# Patient Record
Sex: Female | Born: 1995 | Race: Black or African American | Hispanic: No | Marital: Single | Smoking: Never smoker
Health system: Southern US, Community
[De-identification: ages and names within clinical notes are randomized; demographics above are authoritative.]

## PROBLEM LIST (undated history)

## (undated) DIAGNOSIS — O99119 Other diseases of the blood and blood-forming organs and certain disorders involving the immune mechanism complicating pregnancy, unspecified trimester: Secondary | ICD-10-CM

## (undated) DIAGNOSIS — J020 Streptococcal pharyngitis: Secondary | ICD-10-CM

## (undated) DIAGNOSIS — Z789 Other specified health status: Secondary | ICD-10-CM

## (undated) DIAGNOSIS — O24419 Gestational diabetes mellitus in pregnancy, unspecified control: Secondary | ICD-10-CM

## (undated) DIAGNOSIS — D696 Thrombocytopenia, unspecified: Secondary | ICD-10-CM

## (undated) HISTORY — DX: Thrombocytopenia, unspecified: O99.119

## (undated) HISTORY — PX: NO PAST SURGERIES: SHX2092

## (undated) HISTORY — DX: Thrombocytopenia, unspecified: D69.6

## (undated) HISTORY — DX: Gestational diabetes mellitus in pregnancy, unspecified control: O24.419

## (undated) HISTORY — DX: Other specified health status: Z78.9

---
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
## 2017-01-18 ENCOUNTER — Emergency Department (HOSPITAL_COMMUNITY): Payer: Medicaid Other

## 2017-01-18 ENCOUNTER — Emergency Department (HOSPITAL_COMMUNITY)
Admission: EM | Admit: 2017-01-18 | Discharge: 2017-01-18 | Disposition: A | Discharge status: Home / Self Care | Payer: Medicaid Other | Attending: Emergency Medicine | Admitting: Emergency Medicine

## 2017-01-18 ENCOUNTER — Encounter (HOSPITAL_COMMUNITY): Payer: Self-pay | Admitting: Emergency Medicine

## 2017-01-18 DIAGNOSIS — S2020XA Contusion of thorax, unspecified, initial encounter: Secondary | ICD-10-CM | POA: Diagnosis not present

## 2017-01-18 DIAGNOSIS — Z79899 Other long term (current) drug therapy: Secondary | ICD-10-CM | POA: Insufficient documentation

## 2017-01-18 DIAGNOSIS — X58XXXA Exposure to other specified factors, initial encounter: Secondary | ICD-10-CM | POA: Insufficient documentation

## 2017-01-18 DIAGNOSIS — J029 Acute pharyngitis, unspecified: Secondary | ICD-10-CM | POA: Insufficient documentation

## 2017-01-18 DIAGNOSIS — S20212A Contusion of left front wall of thorax, initial encounter: Secondary | ICD-10-CM

## 2017-01-18 DIAGNOSIS — Y929 Unspecified place or not applicable: Secondary | ICD-10-CM | POA: Insufficient documentation

## 2017-01-18 DIAGNOSIS — Y999 Unspecified external cause status: Secondary | ICD-10-CM | POA: Diagnosis not present

## 2017-01-18 DIAGNOSIS — Y939 Activity, unspecified: Secondary | ICD-10-CM | POA: Insufficient documentation

## 2017-01-18 LAB — RAPID STREP SCREEN (MED CTR MEBANE ONLY): STREPTOCOCCUS, GROUP A SCREEN (DIRECT): NEGATIVE
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
## 2017-01-18 NOTE — ED Notes (Signed)
Patient transported to X-ray 

## 2017-01-18 NOTE — ED Notes (Signed)
Returned from xray

## 2017-01-18 NOTE — Discharge Instructions (Signed)
You can take Tylenol or Ibuprofen as directed for pain.  You can apply ice to the affected area.   Return to the Emergency Department for any worsening pain, difficulty breathing, chest pain, difficulty swallowing or any other concerns.     If you do not have a primary care doctor you see regularly, please you the list below. Please call them to arrange for follow-up.    No Primary Care Doctor [facility]  [phone] Other agencies that provide inexpensive medical care    [facility]  [phone]    [facility]  [phone]    [facility]  [phone]    [facility]  [phone]    [facility]  [phone]    [facility]  [phone][
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
## 2017-01-18 NOTE — ED Triage Notes (Signed)
Onset today developed sore throat and left rib pain. Airway intact bilateral equal chest rise and fall.

## 2017-01-20 LAB — CULTURE, GROUP A STREP (THRC)

## 2017-11-07 ENCOUNTER — Encounter (HOSPITAL_COMMUNITY): Payer: Self-pay | Admitting: *Deleted

## 2017-11-07 ENCOUNTER — Emergency Department (HOSPITAL_COMMUNITY)
Admission: EM | Admit: 2017-11-07 | Discharge: 2017-11-07 | Disposition: A | Discharge status: Home / Self Care | Payer: Medicaid Other | Attending: Emergency Medicine | Admitting: Emergency Medicine

## 2017-11-07 DIAGNOSIS — B002 Herpesviral gingivostomatitis and pharyngotonsillitis: Secondary | ICD-10-CM | POA: Insufficient documentation

## 2017-11-07 DIAGNOSIS — R21 Rash and other nonspecific skin eruption: Secondary | ICD-10-CM | POA: Diagnosis present

## 2017-11-07 MED ORDER — ACYCLOVIR 400 MG PO TABS: 400.0000 mg | ORAL_TABLET | Freq: Three times a day (TID) | ORAL | 1 refills | Status: AC

## 2017-11-07 NOTE — ED Provider Notes (Signed)
[facility]-EMERGENCY DEPT Provider Note   CSN: [id number] Arrival date & time: 11/07/17  1311     History   Chief Complaint Chief Complaint  Patient presents with  . Mouth Lesions    HPI Shelly Watson is a 22 y.o. female Pt presenting to the ED with 3 days of rash to lips.  She states she initially noticed the rash on the left corner of mouth.  She states it then spread to her bottom lip has been painful and itchy.  States she has had similar rash in the past, however it has not been this significant.  States she is not currently sexually active.  Denies any sores inside of her mouth, or any other complaints.  The history is provided by the patient.    Past Medical History:  Diagnosis Date  . Strep throat     There are no active problems to display for this patient.   History reviewed. No pertinent surgical history.   OB History   None      Home Medications    Prior to Admission medications   Medication Sig Start Date End Date Taking? Authorizing Provider  acetaminophen (TYLENOL) 160 MG/5ML solution Take 10 mLs (320 mg total) by mouth every 6 (six) hours as needed. 09/26/14   Marlon PelGreene, Tiffany, PA-C  acyclovir (ZOVIRAX) 400 MG tablet Take 1 tablet (400 mg total) by mouth 3 (three) times daily for 10 days. 11/07/17 11/17/17  Natalija Mavis, [place] N, PA-C  bacitracin 500 UNIT/GM ointment Apply 1 application topically 3 (three) times daily. 04/23/14   Lowanda FosterBrewer, Mindy, NP  naproxen sodium (ANAPROX) 220 MG tablet Take 220 mg by mouth 2 (two) times daily as needed (for pain).     [provider]  ondansetron (ZOFRAN ODT) 4 MG disintegrating tablet Take 1 tablet (4 mg total) by mouth every 8 (eight) hours as needed. 03/01/14   Viviano Simasobinson, Lauren, NP    Family History No family history on file.  Social History Social History   Tobacco Use  . Smoking status: Never Smoker  . Smokeless tobacco: Never Used  Substance Use Topics  . Alcohol use: No  . Drug  use: No     Allergies   Aspirin   Review of Systems Review of Systems  Constitutional: Negative for fever.  Skin: Positive for rash.     Physical Exam Updated Vital Signs BP 93/69 (BP Location: Left Arm)   Pulse 60   Temp 98.4 F (36.9 C) (Oral)   Resp 15   Ht 5\' 4"  (1.626 m)   Wt 68 kg (150 lb)   SpO2 100%   BMI 25.75 kg/m   Physical Exam  Constitutional: She appears well-developed and well-nourished.  HENT:  Head: Normocephalic and atraumatic.  Mouth/Throat:    multiple areas of grouped clear vesicles on erythematous base in corners of lips as well as lower lip.  No involvement of oral mucosa.  No purulence or surrounding erythema. No desquamation. Oropharynx clear without edema. Tolerating secretions.  Eyes: Conjunctivae are normal.  Pulmonary/Chest: Effort normal.  Psychiatric: She has a normal mood and affect. Her behavior is normal.  Nursing note and vitals reviewed.    ED Treatments / Results  Labs (all labs ordered are listed, but only abnormal results are displayed) Labs Reviewed - No data to display  EKG None  Radiology No results found.  Procedures Procedures (including critical care time)  Medications Ordered in ED Medications - No data to display   Initial  Impression / Assessment and Plan / ED Course  I have reviewed the triage vital signs and the nursing notes.  Pertinent labs & imaging results that were available during my care of the patient were reviewed by me and considered in my medical decision making (see chart for details).     Patient with rash surrounding mouth consistent with herpes simplex virus.  Patient is afebrile, tolerating secretions.  No involvement of intraoral mucosa.  No purulence or signs of bacterial infection. Rx for acyclovir provided. Discussed PCP follow up and return precautions. Safe for discharge.  Discussed results, findings, treatment and follow up. Patient advised of return precautions. Patient  verbalized understanding and agreed with plan.  Final Clinical Impressions(s) / ED Diagnoses   Final diagnoses:  Oral herpes simplex infection    ED Discharge Orders        Ordered    acyclovir (ZOVIRAX) 400 MG tablet  3 times daily     11/07/17 1419       Layna Roeper, [place], [place] 11/07/17 1426    Arby Barrette, MD 11/08/17 [phone]

## 2017-11-07 NOTE — ED Triage Notes (Signed)
2 mouth lesions noted on lips causing pain

## 2017-11-07 NOTE — Discharge Instructions (Addendum)
Take the antiviral (acyclovir) 3 times per day (every 8 hours) for 7-10 days. Be aware that this is contagious. You can use over the counter topical agents as needed for pain. You can also take advil/ibuprofen every 6 hours as needed for pain.

## 2018-07-17 IMAGING — DX DG CHEST 2V
2 series · 2 of 2 positions shown · non-contrast
Comparison: None.

CLINICAL DATA: Pleuritic left rib and chest pain.  No known injury.

EXAM:
CHEST  2 VIEW

[w chest pa]
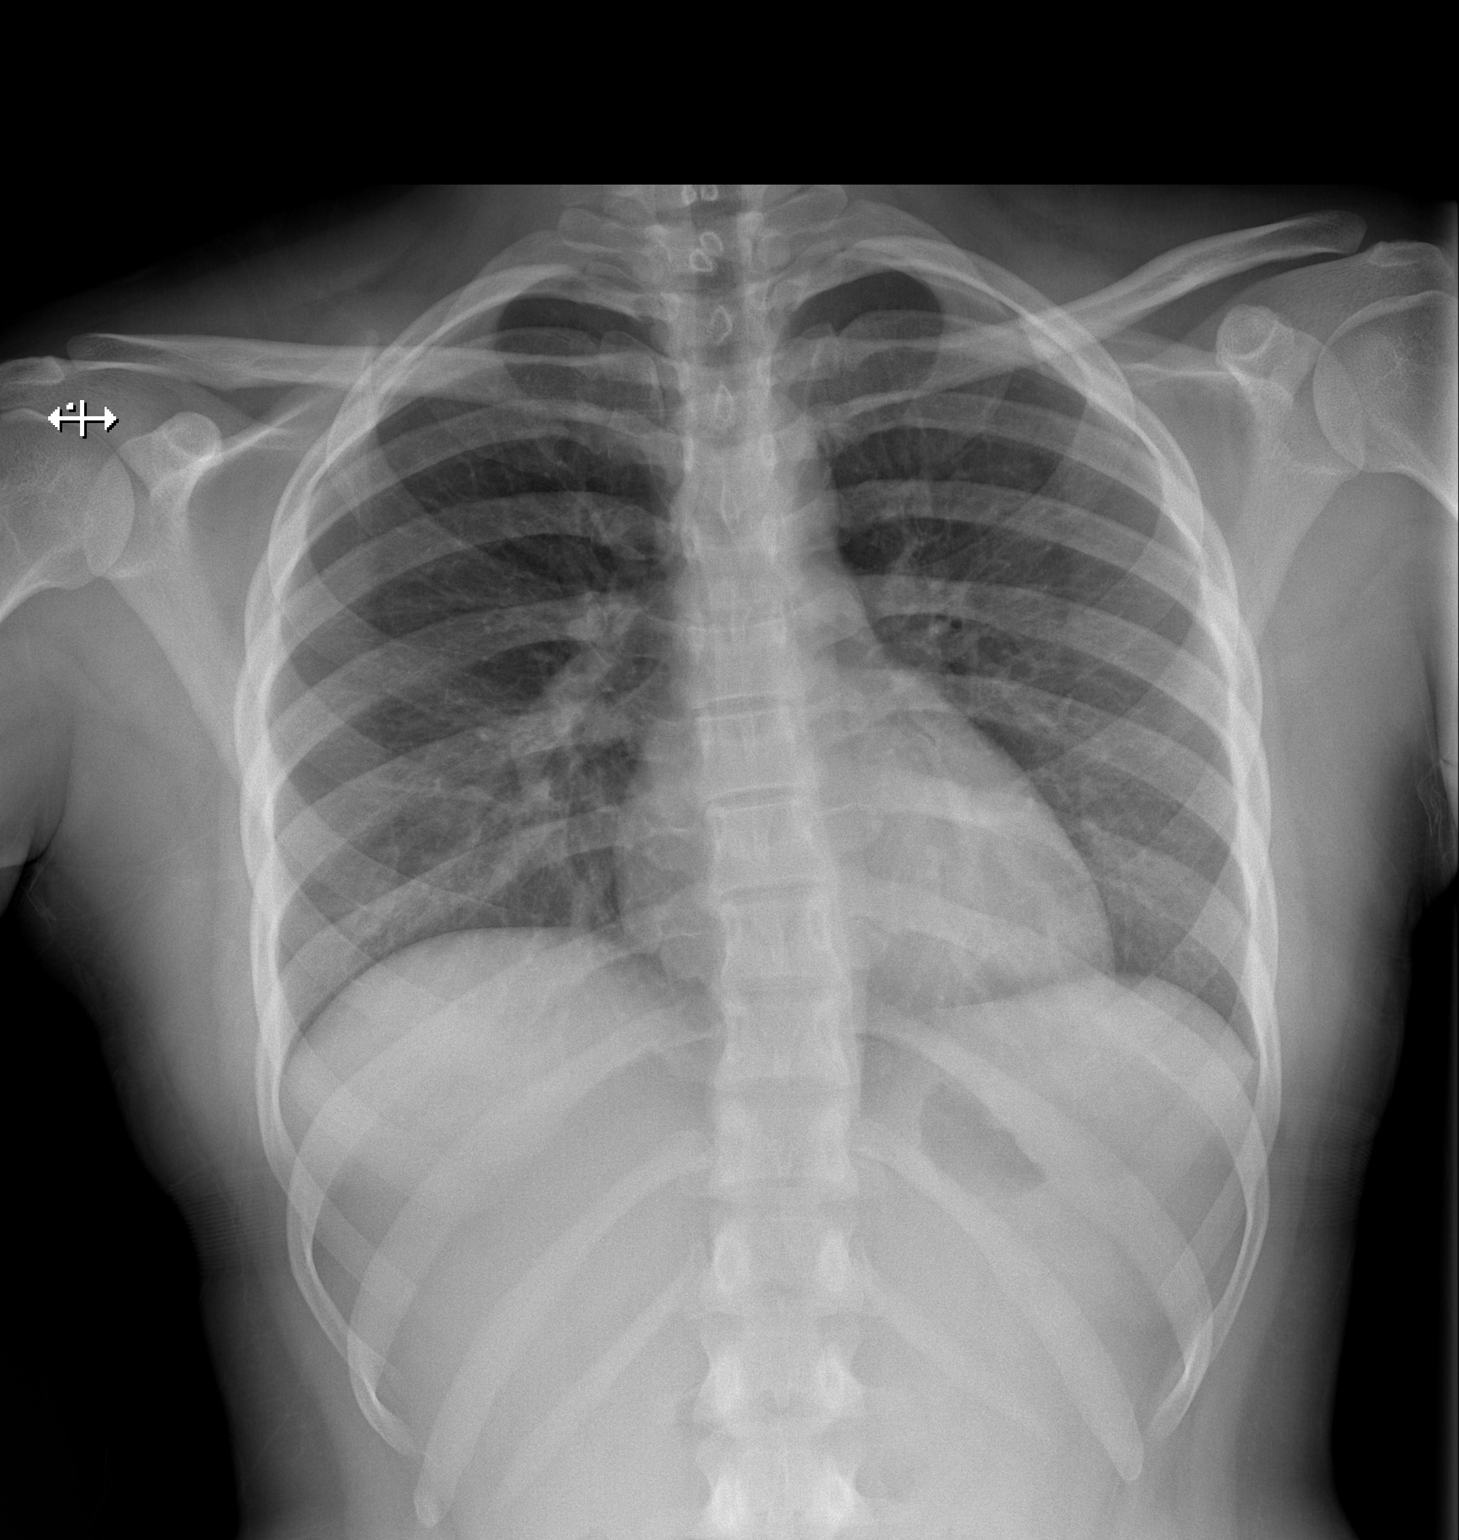

[w chest lat]
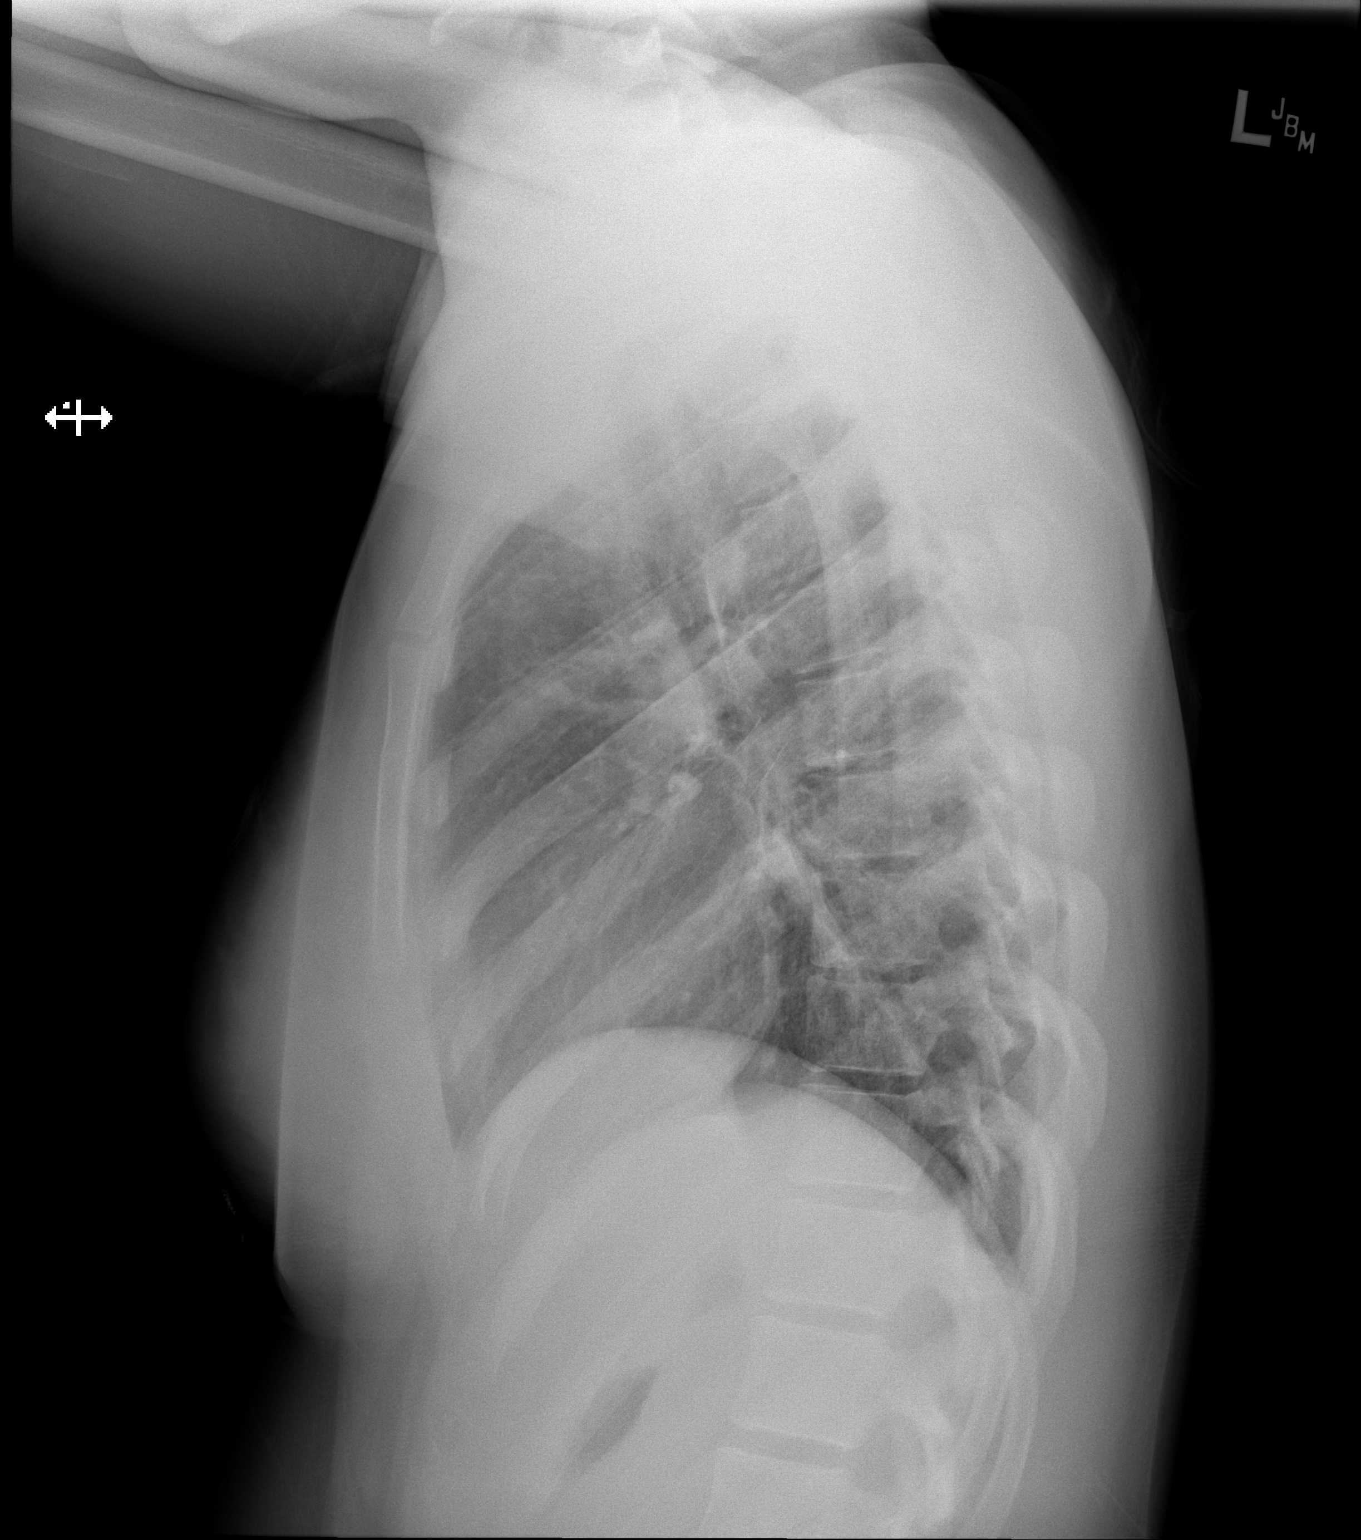

[2 of 2 positions shown; findings below may reference images not displayed]

FINDINGS: The heart size and mediastinal contours are within normal limits.
Both lungs are clear. No evidence of pneumothorax or pleural
effusion. The visualized skeletal structures are unremarkable.
IMPRESSION: Negative.  No active cardiopulmonary disease.

## 2022-01-16 ENCOUNTER — Ambulatory Visit
Admission: EM | Admit: 2022-01-16 | Discharge: 2022-01-16 | Disposition: A | Discharge status: Home / Self Care | Payer: 59 | Attending: Urgent Care | Admitting: Urgent Care

## 2022-01-16 ENCOUNTER — Encounter: Payer: Self-pay | Admitting: Emergency Medicine

## 2022-01-16 DIAGNOSIS — A084 Viral intestinal infection, unspecified: Secondary | ICD-10-CM | POA: Diagnosis not present

## 2022-01-16 DIAGNOSIS — R052 Subacute cough: Secondary | ICD-10-CM | POA: Diagnosis not present

## 2022-01-16 DIAGNOSIS — R0982 Postnasal drip: Secondary | ICD-10-CM | POA: Diagnosis not present

## 2022-01-16 MED ORDER — ONDANSETRON 8 MG PO TBDP: 8.0000 mg | ORAL_TABLET | Freq: Three times a day (TID) | ORAL | 0 refills | Status: DC | PRN

## 2022-01-16 MED ORDER — PROMETHAZINE-DM 6.25-15 MG/5ML PO SYRP: 2.5000 mL | ORAL_SOLUTION | Freq: Three times a day (TID) | ORAL | 0 refills | Status: DC | PRN

## 2022-01-16 MED ORDER — PSEUDOEPHEDRINE HCL 60 MG PO TABS: 60.0000 mg | ORAL_TABLET | Freq: Three times a day (TID) | ORAL | 0 refills | Status: DC | PRN

## 2022-01-16 MED ORDER — ONDANSETRON 8 MG PO TBDP
8.0000 mg | ORAL_TABLET | Freq: Once | ORAL | Status: AC
  Administered 2022-01-16: 8 mg via ORAL

## 2022-01-16 MED ORDER — CETIRIZINE HCL 10 MG PO TABS: 10.0000 mg | ORAL_TABLET | Freq: Every day | ORAL | 0 refills | Status: DC

## 2022-01-16 NOTE — ED Triage Notes (Signed)
Pt here with nausea and abdominal pain since eating moldy strawberries last night.

## 2022-01-16 NOTE — ED Provider Notes (Signed)
[facility]   MRN: [id number] DOB: [birth year]  Subjective:   Shelly Watson is a 26 y.o. female presenting for acute onset since very early this morning of upset stomach, nausea, belly pain and cramping.  No vomiting, diarrhea.  No bloody stools.  No fevers.  Patient has also had scratchy throat with drainage.  She has been coughing.  No chest pain, shortness of breath or wheezing.  No smoking.  No drug use, no alcohol use.  No current facility-administered medications for this encounter. No current outpatient medications on file.   Allergies  Allergen Reactions   Aspirin Itching and Rash    Past Medical History:  Diagnosis Date   Strep throat      History reviewed. No pertinent surgical history.  History reviewed. No pertinent family history.  Social History   Tobacco Use   Smoking status: Never   Smokeless tobacco: Never  Substance Use Topics   Alcohol use: No   Drug use: No    ROS   Objective:   Vitals: BP 96/62 (BP Location: Left Arm)   Pulse 68   Temp 98.4 F (36.9 C) (Oral)   Resp 20   LMP 12/19/2021 (Exact Date)   SpO2 96%   Physical Exam Constitutional:      General: She is not in acute distress.    Appearance: Normal appearance. She is well-developed. She is not ill-appearing, toxic-appearing or diaphoretic.  HENT:     Head: Normocephalic and atraumatic.     Nose: Nose normal.     Mouth/Throat:     Mouth: Mucous membranes are moist.     Pharynx: No pharyngeal swelling, oropharyngeal exudate, posterior oropharyngeal erythema or uvula swelling.     Tonsils: No tonsillar exudate or tonsillar abscesses. 0 on the right. 0 on the left.     Comments: Significant postnasal drainage overlying pharynx. Eyes:     General: No scleral icterus.       Right eye: No discharge.        Left eye: No discharge.     Extraocular Movements: Extraocular movements intact.     Conjunctiva/sclera: Conjunctivae normal.  Cardiovascular:      Rate and Rhythm: Normal rate.  Pulmonary:     Effort: Pulmonary effort is normal.  Abdominal:     General: There is no distension.     Palpations: Abdomen is soft. There is no mass.     Tenderness: There is abdominal tenderness (Generalized and mild throughout). There is no right CVA tenderness, left CVA tenderness, guarding or rebound.     Comments: Hyperactive bowel sounds.  Skin:    General: Skin is warm and dry.  Neurological:     General: No focal deficit present.     Mental Status: She is alert and oriented to person, place, and time.  Psychiatric:        Mood and Affect: Mood normal.        Behavior: Behavior normal.        Thought Content: Thought content normal.        Judgment: Judgment normal.     Assessment and Plan :   PDMP not reviewed this encounter.  1. Viral gastroenteritis   2. Post-nasal drainage   3. Subacute cough    Make sure you push fluids drinking mostly water but mix it with Gatorade.  Try to eat light meals including soups, broths and soft foods, fruits.  You may use Zofran for your nausea and vomiting  once every 8 hours.  Imodium can help with diarrhea but use this carefully limiting it to 1-2 times per day only if you are having a lot of diarrhea.  Suspect patient also has a concurrent viral URI.  Recommend supportive care. Deferred imaging given clear cardiopulmonary exam, hemodynamically stable vital signs. Counseled patient on potential for adverse effects with medications prescribed/recommended today, ER and return-to-clinic precautions discussed, patient verbalized understanding.    Wallis Bamberg, [place] 01/16/22 1651

## 2022-01-16 NOTE — Discharge Instructions (Addendum)
Make sure you push fluids drinking mostly water but mix it with Gatorade.  Try to eat light meals including soups, broths and soft foods, fruits.  You may use Zofran for your nausea and vomiting once every 8 hours.  Imodium (loperamide) can help with diarrhea but use this carefully limiting it to 1-2 times per day only if you are having a lot of diarrhea.  Please return to the clinic if symptoms worsen or you start having severe abdominal pain not helped by taking Tylenol or start having bloody stools or blood in the vomit. 

## 2022-07-08 ENCOUNTER — Ambulatory Visit: Payer: No Typology Code available for payment source | Admitting: Medical

## 2022-07-21 NOTE — L&D Delivery Note (Signed)
[facility] Delivery Note  Elisabetta Livingood is a 27 y.o. G1P0000 s/p SVD at [redacted]w[redacted]d. She was admitted for SROM.   ROM: 13h 38m with clear fluid GBS Status: Positive, given PCN Maximum Maternal Temperature: 98.9  Labor Progress: Pt came in with positive ferning but declined all cervical checks until 1815 when she agreed to of fentanyl and a cervical exam. Found to be complete and 0-+1 station.   Delivery Date/Time: 05/22/23 at 1917 Delivery: Head delivered LOA but rotated to direct OP. No nuchal cord present. dystocia followed that was resolved by McRoberts and grasping of the anterior shoulder then baby rotated to LOA. Shoulder and body delivered in usual fashion. Infant with spontaneous cry (NICU team able to leave), placed on mother's abdomen, dried and stimulated. Cord clamped x 2 after 5-minute delay, and cut by partner Colin Mulders). Cord blood drawn. Placenta delivered spontaneously, intact, with 3-vessel cord. Fundus firm with massage and Pitocin. Labia, perineum, vagina, and cervix inspected, 2nd degree laceration found and repaired with 3.0 vicryl (pt needed numbing twice and an additional of fentanyl and still found it difficult to tolerate more superficial stitching - lac well approximated and hemostatic by end of repair).   Placenta: spontaneous, intact, sent to L&D for disposal Complications: none Lacerations: 2nd degree perineal EBL: Analgesia: none  Postpartum Planning [x]  transfer orders to MB [x]  discharge summary started & shared [x]  message to sent to schedule follow-up  [x]  lists updated  Infant: Girl  APGARs 8/9  pending  Edd Arbour, CNM, IBCLC Certified Nurse Midwife, [facility] for [employer], [facility] 05/22/2023, 7:56 PM

## 2022-08-06 ENCOUNTER — Ambulatory Visit
Admission: EM | Admit: 2022-08-06 | Discharge: 2022-08-06 | Disposition: A | Discharge status: Home / Self Care | Payer: No Typology Code available for payment source | Attending: Physician Assistant | Admitting: Physician Assistant

## 2022-08-06 DIAGNOSIS — Z20822 Contact with and (suspected) exposure to covid-19: Secondary | ICD-10-CM | POA: Diagnosis not present

## 2022-08-06 DIAGNOSIS — J0100 Acute maxillary sinusitis, unspecified: Secondary | ICD-10-CM | POA: Insufficient documentation

## 2022-08-06 MED ORDER — PROMETHAZINE-DM 6.25-15 MG/5ML PO SYRP: 2.5000 mL | ORAL_SOLUTION | Freq: Two times a day (BID) | ORAL | 0 refills | Status: DC | PRN

## 2022-08-06 MED ORDER — AMOXICILLIN-POT CLAVULANATE 875-125 MG PO TABS: 1.0000 | ORAL_TABLET | Freq: Two times a day (BID) | ORAL | 0 refills | Status: DC

## 2022-08-06 NOTE — Discharge Instructions (Signed)
I am concerned that you have a bacterial sinus infection given your symptoms have been present for more than a week.  Please take Augmentin twice daily for 7 days.  Take Promethazine DM for cough.  This will make you sleepy so do not drive or drink alcohol while taking it.  You can use over-the-counter medication for additional symptom relief.  Make sure that you rest and drink plenty of fluid.  We will contact you if your COVID test is positive.  Please monitor your MyChart for these results.  If her symptoms are improving within a week return for reevaluation.  If anything worsens you need to be seen immediately.

## 2022-08-06 NOTE — ED Provider Notes (Signed)
[facility]    CSN: [id number] Arrival date & time: 08/06/22  1514      History   Chief Complaint Chief Complaint  Patient presents with   Headache   Fever    HPI Shelly Watson is a 27 y.o. female.   Patient presents today with a week plus long history of URI symptoms including headache, fever, cough, congestion.  She denies any chest pain, shortness of breath, vomiting, diarrhea.  She has tried NyQuil without improvement of symptoms.  Reports sick contacts at work with tested positive for COVID.  She has not had COVID in the past.  She has not had COVID or influenza vaccines.  She denies any significant past medical history including asthma, COPD, smoking, diabetes.  She does have allergies managed with as needed over-the-counter antihistamines.  She denies any recent antibiotics or steroids.  She is confident that she is not pregnant.  She is having difficulty with daily activities and had to leave work as a result of symptoms.  She is required to have a COVID test before returning and so requesting that this be completed during visit today.    Past Medical History:  Diagnosis Date   Strep throat     There are no problems to display for this patient.   History reviewed. No pertinent surgical history.  OB History   No obstetric history on file.      Home Medications    Prior to Admission medications   Medication Sig Start Date End Date Taking? Authorizing Provider  amoxicillin-clavulanate (AUGMENTIN) 875-125 MG tablet Take 1 tablet by mouth every 12 (twelve) hours. 08/06/22  Yes Chalsea Darko K, PA-C  promethazine-dextromethorphan (PROMETHAZINE-DM) 6.25-15 MG/5ML syrup Take 2.5 mLs by mouth 2 (two) times daily as needed for cough. 08/06/22   Shahzad Thomann, Noberto Retort, PA-C    Family History History reviewed. No pertinent family history.  Social History Social History   Tobacco Use   Smoking status: Never   Smokeless tobacco: Never  Substance Use Topics    Alcohol use: No   Drug use: No     Allergies   Aspirin   Review of Systems Review of Systems  Constitutional:  Positive for activity change, fatigue and fever. Negative for appetite change.  HENT:  Positive for congestion, postnasal drip, sinus pressure and sore throat. Negative for sneezing.   Respiratory:  Positive for cough. Negative for shortness of breath.   Cardiovascular:  Negative for chest pain.  Gastrointestinal:  Positive for nausea. Negative for abdominal pain, diarrhea and vomiting.  Neurological:  Positive for headaches. Negative for dizziness and light-headedness.     Physical Exam Triage Vital Signs ED Triage Vitals  Enc Vitals Group     BP 08/06/22 1609 99/64     Pulse Rate 08/06/22 1609 62     Resp 08/06/22 1609 15     Temp 08/06/22 1609 98.6 F (37 C)     Temp Source 08/06/22 1609 Oral     SpO2 08/06/22 1609 97 %     Weight --      Height --      Head Circumference --      Peak Flow --      Pain Score 08/06/22 1611 6     Pain Loc --      Pain Edu? --      Excl. in GC? --    No data found.  Updated Vital Signs BP 99/64 (BP Location: Right Arm)   Pulse  62   Temp 98.6 F (37 C) (Oral)   Resp 15   LMP  (Within Weeks) Comment: 1 week  SpO2 97%   Visual Acuity Right Eye Distance:   Left Eye Distance:   Bilateral Distance:    Right Eye Near:   Left Eye Near:    Bilateral Near:     Physical Exam Vitals reviewed.  Constitutional:      General: She is awake. She is not in acute distress.    Appearance: Normal appearance. She is well-developed. She is not ill-appearing.     Comments: Very pleasant female appears stated age in no acute distress sitting comfortably in exam room  HENT:     Head: Normocephalic and atraumatic.     Right Ear: Tympanic membrane, ear canal and external ear normal. Tympanic membrane is not erythematous or bulging.     Left Ear: Tympanic membrane, ear canal and external ear normal. Tympanic membrane is not  erythematous or bulging.     Nose:     Right Sinus: Maxillary sinus tenderness present. No frontal sinus tenderness.     Left Sinus: Maxillary sinus tenderness present. No frontal sinus tenderness.     Mouth/Throat:     Pharynx: Uvula midline. Posterior oropharyngeal erythema present. No oropharyngeal exudate.     Comments: Erythema and drainage in posterior oropharynx Cardiovascular:     Rate and Rhythm: Normal rate and regular rhythm.     Heart sounds: Normal heart sounds, S1 normal and S2 normal. No murmur heard. Pulmonary:     Effort: Pulmonary effort is normal.     Breath sounds: Normal breath sounds. No wheezing, rhonchi or rales.     Comments: Clear to auscultation bilaterally Psychiatric:        Behavior: Behavior is cooperative.      UC Treatments / Results  Labs (all labs ordered are listed, but only abnormal results are displayed) Labs Reviewed  SARS CORONAVIRUS 2 (TAT 6-24 HRS)    EKG   Radiology No results found.  Procedures Procedures (including critical care time)  Medications Ordered in UC Medications - No data to display  Initial Impression / Assessment and Plan / UC Course  I have reviewed the triage vital signs and the nursing notes.  Pertinent labs & imaging results that were available during my care of the patient were reviewed by me and considered in my medical decision making (see chart for details).     Patient is well-appearing, afebrile, nontoxic, nontachycardic.  Given her prolonged and recent worsening of symptoms concern for secondary bacterial infection.  She was started on Augmentin twice daily for 7 days.  Discussed that antibiotics can decrease the effectiveness of birth control and she should use backup birth control such as a condom while on this medication.  Recommended over-the-counter medication including NyQuil, Flonase, Tylenol.  She is to rest and drink plenty of fluid.  Recommended that she use nasal saline/sinus rinses for  additional symptom relief.  COVID testing was obtained but we discussed that this would not change our management since she has been symptomatic for over a week.  She is to rest and drink plenty of fluid.  Promethazine DM was sent for cough with instruction not to drive or drink alcohol while taking it.  Discussed that if her symptoms are not improving within a week she is to return for reevaluation.  If she has any worsening or changing symptoms she needs to be seen immediately.  Strict return precautions given.  Work excuse note provided.  Final Clinical Impressions(s) / UC Diagnoses   Final diagnoses:  Acute non-recurrent maxillary sinusitis  Encounter for laboratory testing for COVID-19 virus     Discharge Instructions      I am concerned that you have a bacterial sinus infection given your symptoms have been present for more than a week.  Please take Augmentin twice daily for 7 days.  Take Promethazine DM for cough.  This will make you sleepy so do not drive or drink alcohol while taking it.  You can use over-the-counter medication for additional symptom relief.  Make sure that you rest and drink plenty of fluid.  We will contact you if your COVID test is positive.  Please monitor your MyChart for these results.  If her symptoms are improving within a week return for reevaluation.  If anything worsens you need to be seen immediately.     ED Prescriptions     Medication Sig Dispense Auth. Provider   promethazine-dextromethorphan (PROMETHAZINE-DM) 6.25-15 MG/5ML syrup Take 2.5 mLs by mouth 2 (two) times daily as needed for cough. 100 mL Kash Davie K, PA-C   amoxicillin-clavulanate (AUGMENTIN) 875-125 MG tablet Take 1 tablet by mouth every 12 (twelve) hours. 14 tablet Tymika Grilli, Derry Skill, PA-C      PDMP not reviewed this encounter.   Terrilee Croak, PA-C 08/06/22 1634

## 2022-08-06 NOTE — ED Triage Notes (Signed)
Pt reports headache, fever 101.0 F and cough x  1 week.

## 2022-08-07 LAB — SARS CORONAVIRUS 2 (TAT 6-24 HRS): SARS Coronavirus 2: NEGATIVE

## 2022-09-09 NOTE — Progress Notes (Deleted)
27 y.o. No obstetric history on file. Single African American female here for annual exam.    PCP:     No LMP recorded.           Sexually active: {yes no:314532}  The current method of family planning is {contraception:315051}.    Exercising: {yes no:314532}  {types:19826} Smoker:  {YES NO:22349}  Health Maintenance: Pap:  *** History of abnormal Pap:  {YES NO:22349} MMG:  *** Colonoscopy:  *** BMD:   ***  Result  *** TDaP:  *** Gardasil:   {YES NO:22349} HIV: Hep C: Screening Labs:  Hb today: ***, Urine today: ***   reports that she has never smoked. She has never used smokeless tobacco. She reports that she does not drink alcohol and does not use drugs.  Past Medical History:  Diagnosis Date   Strep throat     No past surgical history on file.  Current Outpatient Medications  Medication Sig Dispense Refill   amoxicillin-clavulanate (AUGMENTIN) 875-125 MG tablet Take 1 tablet by mouth every 12 (twelve) hours. 14 tablet 0   promethazine-dextromethorphan (PROMETHAZINE-DM) 6.25-15 MG/5ML syrup Take 2.5 mLs by mouth 2 (two) times daily as needed for cough. 100 mL 0   No current facility-administered medications for this visit.    No family history on file.  Review of Systems  Exam:   There were no vitals taken for this visit.    General appearance: alert, cooperative and appears stated age Head: normocephalic, without obvious abnormality, atraumatic Neck: no adenopathy, supple, symmetrical, trachea midline and thyroid normal to inspection and palpation Lungs: clear to auscultation bilaterally Breasts: normal appearance, no masses or tenderness, No nipple retraction or dimpling, No nipple discharge or bleeding, No axillary adenopathy Heart: regular rate and rhythm Abdomen: soft, non-tender; no masses, no organomegaly Extremities: extremities normal, atraumatic, no cyanosis or edema Skin: skin color, texture, turgor normal. No rashes or lesions Lymph nodes:  cervical, supraclavicular, and axillary nodes normal. Neurologic: grossly normal  Pelvic: External genitalia:  no lesions              No abnormal inguinal nodes palpated.              Urethra:  normal appearing urethra with no masses, tenderness or lesions              Bartholins and Skenes: normal                 Vagina: normal appearing vagina with normal color and discharge, no lesions              Cervix: no lesions              Pap taken: {yes no:314532} Bimanual Exam:  Uterus:  normal size, contour, position, consistency, mobility, non-tender              Adnexa: no mass, fullness, tenderness              Rectal exam: {yes no:314532}.  Confirms.              Anus:  normal sphincter tone, no lesions  Chaperone was present for exam:  ***  Assessment:   Well woman visit with gynecologic exam.   Plan: Mammogram screening discussed. Self breast awareness reviewed. Pap and HR HPV as above. Guidelines for Calcium, Vitamin D, regular exercise program including cardiovascular and weight bearing exercise.   Follow up annually and prn.   Additional counseling given.  {yes [id number]. _______ minutes face to  face time of which over 50% was spent in counseling.    After visit summary provided.

## 2022-09-10 ENCOUNTER — Encounter: Payer: No Typology Code available for payment source | Admitting: Obstetrics and Gynecology

## 2022-10-09 ENCOUNTER — Ambulatory Visit
Admission: EM | Admit: 2022-10-09 | Discharge: 2022-10-09 | Disposition: A | Discharge status: Home / Self Care | Payer: Medicaid Other | Attending: Urgent Care | Admitting: Urgent Care

## 2022-10-09 DIAGNOSIS — N912 Amenorrhea, unspecified: Secondary | ICD-10-CM

## 2022-10-09 DIAGNOSIS — R102 Pelvic and perineal pain: Secondary | ICD-10-CM

## 2022-10-09 DIAGNOSIS — Z3201 Encounter for pregnancy test, result positive: Secondary | ICD-10-CM | POA: Diagnosis not present

## 2022-10-09 LAB — POCT URINALYSIS DIP (MANUAL ENTRY)
Bilirubin, UA: NEGATIVE
Blood, UA: NEGATIVE
Glucose, UA: NEGATIVE mg/dL
Ketones, POC UA: NEGATIVE mg/dL
Leukocytes, UA: NEGATIVE
Nitrite, UA: NEGATIVE
Protein Ur, POC: NEGATIVE mg/dL
Spec Grav, UA: 1.02 (ref 1.010–1.025)
Urobilinogen, UA: 0.2 E.U./dL
pH, UA: 7.5 (ref 5.0–8.0)

## 2022-10-09 LAB — POCT URINE PREGNANCY: Preg Test, Ur: POSITIVE — AB

## 2022-10-09 NOTE — Discharge Instructions (Addendum)
Start a pre-natal vitamin. As you are pregnant, please just use Tylenol at a dose of 500mg -650mg  once every 6 hours as needed for your aches, pains, fevers. Do not use any nonsteroidal anti-inflammatories (NSAIDs) like ibuprofen, Motrin, naproxen, Aleve, etc. which are all available over-the-counter. Set up your OB/GYN care with the [facility] healthcare center.

## 2022-10-09 NOTE — ED Provider Notes (Signed)
[facility]  Note:  This document was prepared using Systems analyst and may include unintentional dictation errors.  MRN: [id number] DOB: November 09, 1995  Subjective:   Shelly Watson is a 27 y.o. female presenting for amenorrhea, fatigue, mild lower abdominal/pelvic cramping, one episode of slight hematuria. Denies fever, n/v, abdominal pain, pelvic pain, rashes, dysuria, urinary frequency, vaginal discharge.  Patient is sexually active, does not use condoms for protection. This would be an unplanned pregnancy.   No current facility-administered medications for this encounter.  Current Outpatient Medications:    amoxicillin-clavulanate (AUGMENTIN) 875-125 MG tablet, Take 1 tablet by mouth every 12 (twelve) hours., Disp: 14 tablet, Rfl: 0   promethazine-dextromethorphan (PROMETHAZINE-DM) 6.25-15 MG/5ML syrup, Take 2.5 mLs by mouth 2 (two) times daily as needed for cough., Disp: 100 mL, Rfl: 0   Allergies  Allergen Reactions   Aspirin Itching and Rash    Past Medical History:  Diagnosis Date   Strep throat      History reviewed. No pertinent surgical history.  History reviewed. No pertinent family history.  Social History   Tobacco Use   Smoking status: Never   Smokeless tobacco: Never  Substance Use Topics   Alcohol use: No   Drug use: No    ROS   Objective:   Vitals: BP 99/65 (BP Location: Right Arm)   Pulse 80   Temp 98.9 F (37.2 C) (Oral)   LMP 08/25/2022   SpO2 99%   Physical Exam Constitutional:      General: She is not in acute distress.    Appearance: Normal appearance. She is well-developed. She is not ill-appearing, toxic-appearing or diaphoretic.  HENT:     Head: Normocephalic and atraumatic.     Nose: Nose normal.     Mouth/Throat:     Mouth: Mucous membranes are moist.  Eyes:     General: No scleral icterus.       Right eye: No discharge.        Left eye: No discharge.     Extraocular Movements:  Extraocular movements intact.     Conjunctiva/sclera: Conjunctivae normal.  Cardiovascular:     Rate and Rhythm: Normal rate.  Pulmonary:     Effort: Pulmonary effort is normal.  Abdominal:     General: Bowel sounds are normal. There is no distension.     Palpations: Abdomen is soft. There is no mass.     Tenderness: There is no abdominal tenderness. There is no right CVA tenderness, left CVA tenderness, guarding or rebound.  Skin:    General: Skin is warm and dry.  Neurological:     General: No focal deficit present.     Mental Status: She is alert and oriented to person, place, and time.  Psychiatric:        Mood and Affect: Mood normal.        Behavior: Behavior normal.        Thought Content: Thought content normal.        Judgment: Judgment normal.     Results for orders placed or performed during the hospital encounter of 10/09/22 (from the past 24 hour(s))  POCT urine pregnancy     Status: Abnormal   Collection Time: 10/09/22  3:12 PM  Result Value Ref Range   Preg Test, Ur Positive (A) Negative  POCT urinalysis dipstick     Status: Abnormal   Collection Time: 10/09/22  3:27 PM  Result Value Ref Range   Color, UA light  yellow (A) yellow   Clarity, UA cloudy (A) clear   Glucose, UA negative negative mg/dL   Bilirubin, UA negative negative   Ketones, POC UA negative negative mg/dL   Spec Grav, UA 1.020 1.010 - 1.025   Blood, UA negative negative   pH, UA 7.5 5.0 - 8.0   Protein Ur, POC negative negative mg/dL   Urobilinogen, UA 0.2 0.2 or 1.0 E.U./dL   Nitrite, UA Negative Negative   Leukocytes, UA Negative Negative    Assessment and Plan :   PDMP not reviewed this encounter.  1. Positive urine pregnancy test   2. Amenorrhea   3. Pelvic cramping     Patient was very overwhelmed with the positive pregnancy test.  I attempted to provide counseling for her including the avoidance of NSAIDs and using just Tylenol for pain.  Recommended starting prenatal vitamin.   Also establish care with an obstetrician.  Provided her with information for this.  Reviewed ER precautions.   Jaynee Eagles, [place] 10/09/22 1544

## 2022-10-09 NOTE — ED Triage Notes (Addendum)
Patient states she is late on her period, has been more fatigued and has had some lower abd discomfort/ cramping.  Started: around 3/5  LMP: 08/25/22

## 2022-10-24 ENCOUNTER — Encounter: Payer: Self-pay | Admitting: Radiology

## 2022-10-24 ENCOUNTER — Inpatient Hospital Stay (HOSPITAL_COMMUNITY): Payer: Medicaid Other

## 2022-10-24 ENCOUNTER — Encounter (HOSPITAL_COMMUNITY): Payer: Self-pay | Admitting: Family Medicine

## 2022-10-24 ENCOUNTER — Other Ambulatory Visit: Payer: Self-pay

## 2022-10-24 ENCOUNTER — Ambulatory Visit: Payer: Medicaid Other | Admitting: Radiology

## 2022-10-24 ENCOUNTER — Inpatient Hospital Stay (HOSPITAL_COMMUNITY)
Admission: AD | Admit: 2022-10-24 | Discharge: 2022-10-24 | Disposition: A | Discharge status: Home / Self Care | Payer: Medicaid Other | Attending: Family Medicine | Admitting: Family Medicine

## 2022-10-24 VITALS — BP 112/76 | Ht 64.0 in | Wt 149.0 lb

## 2022-10-24 DIAGNOSIS — O208 Other hemorrhage in early pregnancy: Secondary | ICD-10-CM | POA: Diagnosis not present

## 2022-10-24 DIAGNOSIS — Z3A08 8 weeks gestation of pregnancy: Secondary | ICD-10-CM | POA: Diagnosis not present

## 2022-10-24 DIAGNOSIS — N912 Amenorrhea, unspecified: Secondary | ICD-10-CM | POA: Diagnosis not present

## 2022-10-24 DIAGNOSIS — Z3201 Encounter for pregnancy test, result positive: Secondary | ICD-10-CM | POA: Diagnosis not present

## 2022-10-24 DIAGNOSIS — R109 Unspecified abdominal pain: Secondary | ICD-10-CM | POA: Diagnosis present

## 2022-10-24 DIAGNOSIS — Z3491 Encounter for supervision of normal pregnancy, unspecified, first trimester: Secondary | ICD-10-CM

## 2022-10-24 LAB — CBC
HCT: 32.6 % — ABNORMAL LOW (ref 36.0–46.0)
Hemoglobin: 11.8 g/dL — ABNORMAL LOW (ref 12.0–15.0)
MCH: 33.1 pg (ref 26.0–34.0)
MCHC: 36.2 g/dL — ABNORMAL HIGH (ref 30.0–36.0)
MCV: 91.3 fL (ref 80.0–100.0)
Platelets: 135 10*3/uL — ABNORMAL LOW (ref 150–400)
RBC: 3.57 MIL/uL — ABNORMAL LOW (ref 3.87–5.11)
RDW: 12.5 % (ref 11.5–15.5)
WBC: 7.9 10*3/uL (ref 4.0–10.5)
nRBC: 0 % (ref 0.0–0.2)

## 2022-10-24 LAB — URINALYSIS, ROUTINE W REFLEX MICROSCOPIC
Bilirubin Urine: NEGATIVE
Glucose, UA: NEGATIVE mg/dL
Hgb urine dipstick: NEGATIVE
Ketones, ur: NEGATIVE mg/dL
Leukocytes,Ua: NEGATIVE
Nitrite: NEGATIVE
Protein, ur: NEGATIVE mg/dL
Specific Gravity, Urine: 1.013 (ref 1.005–1.030)
pH: 7 (ref 5.0–8.0)

## 2022-10-24 LAB — ABO/RH: ABO/RH(D): O POS

## 2022-10-24 LAB — WET PREP, GENITAL
Sperm: NONE SEEN
Trich, Wet Prep: NONE SEEN
WBC, Wet Prep HPF POC: >=10 — AB (ref <10)
Yeast Wet Prep HPF POC: NONE SEEN

## 2022-10-24 LAB — HCG, QUANTITATIVE, PREGNANCY: hCG, Beta Chain, Quant, S: 107649 m[IU]/mL — ABNORMAL HIGH (ref <5)

## 2022-10-24 LAB — PREGNANCY, URINE: Preg Test, Ur: POSITIVE — AB

## 2022-10-24 NOTE — MAU Provider Note (Signed)
History     CSN: [id number]  Arrival date and time: 10/24/22 1241   Event Date/Time   First Provider Initiated Contact with Patient 10/24/22 1514      Chief Complaint  Patient presents with   Abdominal Pain   HPI  Shelly Watson is a 27 y.o. G1P0000 at [redacted]w[redacted]d who presents for evaluation of abdominal pain and vaginal bleeding. Patient reports the pain started last night and comes and goes. Patient rates the pain as a 8/10 and has not tried anything for the pain. She reports she saw spotting when she wiped last night but none today. She denies any discharge and leaking of fluid. Denies any constipation, diarrhea or any urinary complaints. She has not been seen anywhere with the pregnancy yet.   OB History     Gravida  1   Para  0   Term  0   Preterm  0   AB  0   Living  0      SAB  0   IAB  0   Ectopic  0   Multiple  0   Live Births  0           Past Medical History:  Diagnosis Date   Strep throat     No past surgical history on file.  No family history on file.  Social History   Tobacco Use   Smoking status: Never    Passive exposure: Never   Smokeless tobacco: Never  Substance Use Topics   Alcohol use: No   Drug use: No    Allergies:  Allergies  Allergen Reactions   Aspirin Itching and Rash    No medications prior to admission.    Review of Systems  Constitutional: Negative.  Negative for fatigue and fever.  HENT: Negative.    Respiratory: Negative.  Negative for shortness of breath.   Cardiovascular: Negative.  Negative for chest pain.  Gastrointestinal:  Positive for abdominal pain. Negative for constipation, diarrhea, nausea and vomiting.  Genitourinary:  Positive for vaginal bleeding. Negative for dysuria and vaginal discharge.  Neurological: Negative.  Negative for dizziness and headaches.   Physical Exam   Blood pressure (!) 98/52, pulse 68, temperature 98.3 F (36.8 C), temperature source Oral, resp. rate 20, height 5\' 4"   (1.626 m), weight 68.3 kg, last menstrual period 08/24/2022, SpO2 98 %.  Patient Vitals for the past 24 hrs:  BP Temp Temp src Pulse Resp SpO2 Height Weight  10/24/22 1301 (!) 98/52 98.3 F (36.8 C) Oral 68 20 98 % -- --  10/24/22 1258 -- -- -- -- -- -- 5\' 4"  (1.626 m) 68.3 kg    Physical Exam Vitals and nursing note reviewed.  Constitutional:      General: She is not in acute distress.    Appearance: She is well-developed.  HENT:     Head: Normocephalic.  Eyes:     Pupils: Pupils are equal, round, and reactive to light.  Cardiovascular:     Rate and Rhythm: Normal rate and regular rhythm.     Heart sounds: Normal heart sounds.  Pulmonary:     Effort: Pulmonary effort is normal. No respiratory distress.     Breath sounds: Normal breath sounds.  Abdominal:     General: Bowel sounds are normal. There is no distension.     Palpations: Abdomen is soft.     Tenderness: There is no abdominal tenderness.  Skin:    General: Skin is warm and dry.  Neurological:  Mental Status: She is alert and oriented to person, place, and time.  Psychiatric:        Mood and Affect: Mood normal.        Behavior: Behavior normal.        Thought Content: Thought content normal.        Judgment: Judgment normal.      MAU Course  Procedures  Results for orders placed or performed during the hospital encounter of 10/24/22 (from the past 24 hour(s))  ABO/Rh     Status: None   Collection Time: 10/24/22  1:38 PM  Result Value Ref Range   ABO/RH(D) O POS    No rh immune globuloin      NOT A RH IMMUNE GLOBULIN CANDIDATE, PT RH POSITIVE Performed at [facility], [address] [place], [place] [address]   CBC     Status: Abnormal   Collection Time: 10/24/22  1:39 PM  Result Value Ref Range   WBC 7.9 4.0 - 10.5 K/uL   RBC 3.57 (L) 3.87 - 5.11 MIL/uL   Hemoglobin 11.8 (L) 12.0 - 15.0 g/dL   HCT 60.432.6 (L) 54.036.0 - 98.146.0 %   MCV 91.3 80.0 - 100.0 fL   MCH 33.1 26.0 - 34.0 pg   MCHC 36.2  (H) 30.0 - 36.0 g/dL   RDW 19.112.5 47.811.5 - 29.515.5 %   Platelets 135 (L) 150 - 400 K/uL   nRBC 0.0 0.0 - 0.2 %  hCG, quantitative, pregnancy     Status: Abnormal   Collection Time: 10/24/22  1:39 PM  Result Value Ref Range   hCG, Beta Chain, Quant, S 107,649 (H) <5 mIU/mL  Urinalysis, Routine w reflex microscopic -Urine, Clean Catch     Status: Abnormal   Collection Time: 10/24/22  1:49 PM  Result Value Ref Range   Color, Urine YELLOW YELLOW   APPearance HAZY (A) CLEAR   Specific Gravity, Urine 1.013 1.005 - 1.030   pH 7.0 5.0 - 8.0   Glucose, UA NEGATIVE NEGATIVE mg/dL   Hgb urine dipstick NEGATIVE NEGATIVE   Bilirubin Urine NEGATIVE NEGATIVE   Ketones, ur NEGATIVE NEGATIVE mg/dL   Protein, ur NEGATIVE NEGATIVE mg/dL   Nitrite NEGATIVE NEGATIVE   Leukocytes,Ua NEGATIVE NEGATIVE  Wet prep, genital     Status: Abnormal   Collection Time: 10/24/22  1:49 PM  Result Value Ref Range   Yeast Wet Prep HPF POC NONE SEEN NONE SEEN   Trich, Wet Prep NONE SEEN NONE SEEN   Clue Cells Wet Prep HPF POC PRESENT (A) NONE SEEN   WBC, Wet Prep HPF POC >=10 (A) <10   Sperm NONE SEEN      US OB Comp Less 14 Wks  Result Date: 10/24/2022 CLINICAL DATA:  [id number] Abdominal pain during pregnancy in first trimester [id number] EXAM: OBSTETRIC <14 WK ULTRASOUND TECHNIQUE: Transabdominal ultrasound was performed for evaluation of the gestation as well as the maternal uterus and adnexal regions. COMPARISON:  None Available. FINDINGS: Intrauterine gestational sac: Single Yolk sac:  Visualized. Embryo:  Visualized. Cardiac Activity: Visualized. Heart Rate: 169 bpm CRL:   18.0 mm   8 w 2 d                  US EDC: 06/03/2023 Subchorionic hemorrhage:  Small Maternal uterus/adnexae: Normal right ovary. The left ovary is not visualized. Trace simple free fluid in pelvis, nonspecific. IMPRESSION: Single viable intrauterine pregnancy. Small subchorionic hemorrhage. Estimated gestational age [redacted] weeks 2 days. Electronically  Signed    By: Caprice RenshawJacob  Kahn M.D.   On: 10/24/2022 15:03     MDM Labs ordered and reviewed.   UA, UPT CBC, HCG ABO/Rh- O Pos Wet prep and gc/chlamydia US OB Comp Less 14 weeks with Transvaginal  Clue cells on wet prep but patient denies abnormal discharge or odor. Does not meet criteria for treatment  CNM independently reviewed the imaging ordered. Imaging show live IUP consistent with dates  Reviewed results of subchorionic hemorrhage with patient and partner. Discussed that this is a common finding in the first trimester and does not usually cause problems in the pregnancy like loss or difficulty with development. Reviewed expectations for vaginal bleeding including a small amount possibly for several weeks. Reviewed warning signs of heavy bleeding, saturating a pad in less than an hour, and severe pain as reasons to come back to MAU. Encouraged patient to exercise pelvic rest until 7 days after bleeding stops. Patient and support person verbalized understanding.   Assessment and Plan   1. Normal intrauterine pregnancy on prenatal ultrasound in first trimester   2. [redacted] weeks gestation of pregnancy   3. Subchorionic hemorrhage of placenta in first trimester     -Discharge home in stable condition -First trimester precautions discussed -Patient advised to follow-up with OB as scheduled for prenatal care -Patient may return to MAU as needed or if her condition were to change or worsen  Rolm BookbinderCaroline M Ethaniel Garfield, CNM 10/24/2022, 3:14 PM

## 2022-10-24 NOTE — MAU Note (Signed)
Shelly Watson is a 26 y.o. at Unknown here in MAU reporting: she's having abdominal pain that began last night.  States had spotting last night too, but none today. LMP: 08/24/2022 Onset of complaint: last night Pain score: 8 Vitals:   10/24/22 1301  BP: (!) 98/52  Pulse: 68  Resp: 20  Temp: 98.3 F (36.8 C)  SpO2: 98%     FHT:NA Lab orders placed from triage:   UA

## 2022-10-24 NOTE — Progress Notes (Signed)
   Shelly Watson 08/17/1995 [id number]   History:  27 y.o. presents for pregnancy confirmation. She was seen in urgent care 10/09/22, notes from that visit were reviewed for continuity of care. She reported fatigue, lower abdominal cramping, missed period and requested pregnancy test. Pregnancy test was positive at that visit. She reported being sexually active without condoms and overwhelmed with results. Today patient requests a second pregnancy test for confirmation. Plans to continue pregnancy if positive. However patient denies being sexually active with males and denies any history of sexual assault and denies home insemination.  I asked patient if she knew how she got pregnant and she stated 'no'. I asked her is she has every been sexually active with a female partner or been exposed to sperm and she said 'no'. I asked her if she was assaulted/foreced to have sex and she said 'no' I then asked if she felt comfortable telling me how she became pregnant and she also answered 'no'.  She has not started a PNV yet.  She denies any fatigue, pain, nausea or bleeding. Reports feeling well.  Admits to feeling safe at home.  Gynecologic History Patient's last menstrual period was 08/25/2022 (exact date). Period Cycle (Days): 28 Period Duration (Days): 5 Period Pattern: Regular Menstrual Flow: Light, Moderate, Heavy Menstrual Control: Maxi pad Dysmenorrhea: (!) Severe Dysmenorrhea Symptoms: Cramping Contraception/Family planning:  female partner Sexually active: yes, reports only with a female partner. Denies every being sexually active with a female partner. Denies any history of sexual violence or assault. Denies self/home insemination with donor sperm.    Obstetric History OB History  Gravida Para Term Preterm AB Living  1 0 0 0 0 0  SAB IAB Ectopic Multiple Live Births  0 0 0 0 0    # Outcome Date GA Lbr Len/2nd Weight Sex Delivery Anes PTL Lv  1 Gravida              The following  portions of the patient's history were reviewed and updated as appropriate: allergies, current medications, past family history, past medical history, past social history, past surgical history, and problem list.  Review of Systems Pertinent items noted in HPI and remainder of comprehensive ROS otherwise negative.   Past medical history, past surgical history, family history and social history were all reviewed and documented in the EPIC chart.   Exam:  Vitals:   10/24/22 1155 10/24/22 1200  BP: 116/64 112/76  Weight: 149 lb (67.6 kg)   Height: 5\' 4"  (1.626 m)    Body mass index is 25.58 kg/m.  Physical Exam Vitals and nursing note reviewed.  Constitutional:      Appearance: She is normal weight.  Pulmonary:     Effort: Pulmonary effort is normal.  Neurological:     Mental Status: She is alert.  Psychiatric:        Mood and Affect: Mood normal.        Thought Content: Thought content normal.        Judgment: Judgment normal.     Assessment/Plan:   1. Amenorrhea - Pregnancy, urine  2. Positive pregnancy test Plans to continue pregnancy Begin PNV Establish with OB practice, list of offices provided with contact information Warning signs reviewed with patient and her female partner  Tanda Rockers WHNP-BC 12:46 PM 10/24/2022

## 2022-10-24 NOTE — Discharge Instructions (Signed)

## 2022-10-27 LAB — GC/CHLAMYDIA PROBE AMP (~~LOC~~) NOT AT ARMC
Chlamydia: NEGATIVE
Comment: NEGATIVE
Comment: NORMAL
Neisseria Gonorrhea: NEGATIVE

## 2022-11-10 ENCOUNTER — Ambulatory Visit (INDEPENDENT_AMBULATORY_CARE_PROVIDER_SITE_OTHER): Payer: Medicaid Other | Admitting: *Deleted

## 2022-11-10 VITALS — BP 108/73 | HR 63 | Wt 154.0 lb

## 2022-11-10 DIAGNOSIS — O0990 Supervision of high risk pregnancy, unspecified, unspecified trimester: Secondary | ICD-10-CM | POA: Insufficient documentation

## 2022-11-10 DIAGNOSIS — Z3481 Encounter for supervision of other normal pregnancy, first trimester: Secondary | ICD-10-CM | POA: Diagnosis not present

## 2022-11-10 DIAGNOSIS — Z3A11 11 weeks gestation of pregnancy: Secondary | ICD-10-CM

## 2022-11-10 DIAGNOSIS — Z3480 Encounter for supervision of other normal pregnancy, unspecified trimester: Secondary | ICD-10-CM

## 2022-11-10 MED ORDER — BLOOD PRESSURE KIT DEVI: 1.0000 | 0 refills | Status: DC

## 2022-11-10 NOTE — Progress Notes (Signed)
New OB Intake  I connected with Shelly Watson  on 11/10/22 at  8:15 AM EDT by In Person Visit and verified that I am speaking with the correct person using two identifiers. Nurse is located at [facility] and pt is located at [place].  I discussed the limitations, risks, security and privacy concerns of performing an evaluation and management service by telephone and the availability of in person appointments. I also discussed with the patient that there may be a patient responsible charge related to this service. The patient expressed understanding and agreed to proceed.  I explained I am completing New OB Intake today. We discussed EDD of 05/31/23 that is based on LMP of 08/24/22. Pt is G1/P0. I reviewed her allergies, medications, Medical/Surgical/OB history, and appropriate screenings. I informed her of [name] services. [name] information placed in AVS. Based on history, this is a low risk pregnancy.  There are no problems to display for this patient.   Concerns addressed today  Delivery Plans Plans to deliver at [name]. Patient given information for [name] Healthy Baby website for more information about [name]. Patient is interested in water birth. Offered upcoming OB visit with CNM to discuss further.  MyChart/Babyscripts MyChart access verified. I explained pt will have some visits in office and some virtually. Babyscripts instructions given and order placed. Patient verifies receipt of registration text/e-mail. Account successfully created and app downloaded.  Blood Pressure Cuff/Weight Scale Blood pressure cuff ordered for patient to pick-up from [name]. Explained after first prenatal appt pt will check weekly and document in Babyscripts. Patient does not have weight scale; patient may purchase if they desire to track weight weekly in Babyscripts.  Anatomy US Explained first scheduled [place] will be around 19 weeks. Anatomy US scheduled for 19 wks at [name]. Pt  notified to arrive at [name].  Labs Discussed Shelly Watson genetic screening with patient. Would like both Panorama and Horizon drawn at new OB visit. Routine prenatal labs needed.  COVID Vaccine Patient has not had COVID vaccine.   Is patient a CenteringPregnancy candidate?  Declined Declined due to Group setting Not a candidate due to  NA  Is patient a Mom+Baby Combined Care candidate?  Accepted   If accepted, Mom+Baby staff notified  Social Determinants of Health Food Insecurity: Patient denies food insecurity. WIC Referral: Patient is interested in referral to [name].  Transportation: Patient denies transportation needs. Childcare: Discussed no children allowed at ultrasound appointments. Offered childcare services; patient declines childcare services at this time.  Interested in [name]? If yes, send referral and doula dot phrase.   First visit review I reviewed new OB appt with patient. I explained they will have a provider visit that includes Pap, labs, urine, discussion. Explained pt will be seen by Corlis Hove, NP at first visit; encounter routed to appropriate provider. Explained that patient will be seen by pregnancy navigator following visit with provider.   Harrel Lemon, RN 11/10/2022  8:08 AM

## 2022-11-12 ENCOUNTER — Institutional Professional Consult (permissible substitution): Payer: Self-pay | Admitting: Licensed Clinical Social Worker

## 2022-11-12 ENCOUNTER — Encounter: Payer: Self-pay | Admitting: Student

## 2022-11-12 ENCOUNTER — Ambulatory Visit (INDEPENDENT_AMBULATORY_CARE_PROVIDER_SITE_OTHER): Payer: Medicaid Other | Admitting: Student

## 2022-11-12 ENCOUNTER — Other Ambulatory Visit (HOSPITAL_COMMUNITY)
Admission: RE | Admit: 2022-11-12 | Discharge: 2022-11-12 | Disposition: A | Discharge status: Home / Self Care | Payer: Medicaid Other | Source: Ambulatory Visit | Attending: Advanced Practice Midwife | Admitting: Advanced Practice Midwife

## 2022-11-12 VITALS — BP 101/63 | HR 75 | Wt 153.6 lb

## 2022-11-12 DIAGNOSIS — Z3491 Encounter for supervision of normal pregnancy, unspecified, first trimester: Secondary | ICD-10-CM

## 2022-11-12 DIAGNOSIS — Z3A11 11 weeks gestation of pregnancy: Secondary | ICD-10-CM

## 2022-11-12 DIAGNOSIS — Z3480 Encounter for supervision of other normal pregnancy, unspecified trimester: Secondary | ICD-10-CM | POA: Diagnosis not present

## 2022-11-12 NOTE — Patient Instructions (Signed)
Safe Medications in Pregnancy   Acne: Benzoyl Peroxide Salicylic Acid  Backache/Headache: Tylenol: 2 regular strength every 4 hours OR              2 Extra strength every 6 hours  Colds/Coughs/Allergies: Benadryl (alcohol free) 25 mg every 6 hours as needed Breath right strips Claritin Cepacol throat lozenges Chloraseptic throat spray Cold-Eeze- up to three times per day Cough drops, alcohol free Flonase (by prescription only) Guaifenesin Mucinex Robitussin DM (plain only, alcohol free) Saline nasal spray/drops Sudafed (pseudoephedrine) & Actifed ** use only after [redacted] weeks gestation and if you do not have high blood pressure Tylenol Vicks Vaporub Zinc lozenges Zyrtec   Constipation: Colace Ducolax suppositories Fleet enema Glycerin suppositories Metamucil Milk of magnesia Miralax Senokot Smooth move tea  Diarrhea: Kaopectate Imodium A-D  *NO pepto Bismol  Hemorrhoids: Anusol Anusol HC Preparation H Tucks  Indigestion: Tums Maalox Mylanta Zantac  Pepcid  Insomnia: Benadryl (alcohol free)  every 6 hours as needed Tylenol PM Unisom, no Gelcaps  Leg Cramps: Tums MagGel  Nausea/Vomiting:  Bonine Dramamine Emetrol Ginger extract Sea bands Meclizine  Nausea medication to take during pregnancy:  Unisom (doxylamine succinate 25 mg tablets) Take one tablet daily at bedtime. If symptoms are not adequately controlled, the dose can be increased to a maximum recommended dose of two tablets daily (1/2 tablet in the morning, 1/2 tablet mid-afternoon and one at bedtime). Vitamin B6  tablets. Take one tablet twice a day (up to 200 mg per day).  Skin Rashes: Aveeno products Benadryl cream or  every 6 hours as needed Calamine Lotion 1% cortisone cream  Yeast infection: Gyne-lotrimin 7 Monistat 7   **If taking multiple medications, please check labels to avoid duplicating the same active ingredients **take medication as directed on  the label ** Do not exceed 4000 mg of tylenol in 24 hours **Do not take medications that contain aspirin or ibuprofen      Considering Waterbirth? Guide for patients at Center for [employer] [facility]) Why consider waterbirth? Gentle birth for babies  Less pain medicine used in labor  May allow for passive descent/less pushing  May reduce perineal tears  More mobility and instinctive maternal position changes  Increased maternal relaxation   Is waterbirth safe? What are the risks of infection, drowning or other complications? Infection:  Very low risk (3.7 % for tub vs 4.8% for bed)  7 in 8000 waterbirths with documented infection  Poorly cleaned equipment most common cause  Slightly lower group B strep transmission rate  Drowning  Maternal:  Very low risk  Related to seizures or fainting  Newborn:  Very low risk. No evidence of increased risk of respiratory problems in multiple large studies  Physiological protection from breathing under water  Avoid underwater birth if there are any fetal complications  Once baby's head is out of the water, keep it out.  Birth complication  Some reports of cord trauma, but risk decreased by bringing baby to surface gradually  No evidence of increased risk of shoulder dystocia. Mothers can usually change positions faster in water than in a bed, possibly aiding the maneuvers to free the shoulder.   There are 2 things you MUST do to have a waterbirth with [facility]: Attend a waterbirth class at [facility] at [facility]   3rd Wednesday of every month from 7-9 pm (virtual during COVID) [employer] at [email or web address] or [email or web address] or by calling [phone] Bring [place] the certificate from the class to  your prenatal appointment or send via MyChart Meet with a midwife at 36 weeks* to see if you can still plan a waterbirth and to sign the consent.   *We also recommend that you schedule as many of your  prenatal visits with a midwife as possible.    Helpful information: You may want to bring a bathing suit top to the hospital to wear during labor but this is optional.  All other supplies are provided by the hospital. Please arrive at the hospital with signs of active labor, and do not wait at home until late in labor. It takes 45 min- 1 hour for fetal monitoring, and check in to your room to take place, plus transport and filling of the waterbirth tub.    Things that would prevent you from having a waterbirth: Premature, <37wks  Previous cesarean birth  Presence of thick meconium-stained fluid  Multiple gestation (Twins, triplets, etc.)  Uncontrolled diabetes or gestational diabetes requiring medication  Hypertension diagnosed in pregnancy or preexisting hypertension (gestational hypertension, preeclampsia, or chronic hypertension) Fetal growth restriction (your baby measures less than 10th percentile on ultrasound) Heavy vaginal bleeding  Non-reassuring fetal heart rate  Active infection (MRSA, etc.). Group B Strep is NOT a contraindication for waterbirth.  If your labor has to be induced and induction method requires continuous monitoring of the baby's heart rate  Other risks/issues identified by your obstetrical provider   Please remember that birth is unpredictable. Under certain unforeseeable circumstances your provider may advise against giving birth in the tub. These decisions will be made on a case-by-case basis and with the safety of you and your baby as our highest priority.    Updated 10/23/21   Options for Doula Care in the [place] Area  As you review your birthing options, consider having a birth doula. A doula is trained to provide support before, during and just after you give birth. There are also postpartum doulas that help you adjust to new parenthood.  While doulas do not provide medical care, they do provide emotional, physical and educational support. A few months  before your baby arrives, doulas can help answer questions, ease concerns and help you create and support your birthing plan.    Doulas can help reduce your stress and comfort you and your partner. They can help you cope with labor by helping you use breathing techniques, massage, creative labor positioning, essential oils and affirmations.   Studies show that the benefits of having a doula include:   A more positive birth experience  Fewer requests for pain-relief medication  Less likelihood of cesarean section, commonly called a c-section   Doulas are typically hired via a [employer] account planner between you and the doula. We are happy to provide a list of the most active doulas in the area, all of whom are credentialed by [facility] and will not count as a visitor at your birth.  There are several options for no-cost doula care at our hospital, including:  [facility] Volunteer Doula Program Every [employer] Program A Cure 4 Moms Doula Study (available only at [employer] for Women, [place], [place] and [employer] [facility] offices)  For more information on these programs or to receive a list of doulas active in our area, please email [email or web address]

## 2022-11-12 NOTE — Progress Notes (Signed)
Pt presents for NOB visit. No concerns at this time.  

## 2022-11-12 NOTE — Progress Notes (Signed)
History:   Shelly Watson is a 27 y.o. G1P0000 at [redacted]w[redacted]d by LMP being seen today for her first obstetrical visit.  Her obstetrical history is remarkable. Patient does intend to breast feed and bottle feed. Pregnancy history fully reviewed.  Patient reports no complaints.     HISTORY: OB History  Gravida Para Term Preterm AB Living  1 0 0 0 0 0  SAB IAB Ectopic Multiple Live Births  0 0 0 0 0    # Outcome Date GA Lbr Len/2nd Weight Sex Delivery Anes PTL Lv  1 Current             Last pap smear was done never.  Past Medical History:  Diagnosis Date   No pertinent past medical history    Strep throat    Past Surgical History:  Procedure Laterality Date   NO PAST SURGERIES     History reviewed. No pertinent family history. Social History   Tobacco Use   Smoking status: Never    Passive exposure: Never   Smokeless tobacco: Never  Vaping Use   Vaping Use: Never used  Substance Use Topics   Alcohol use: No   Drug use: No   Allergies  Allergen Reactions   Aspirin Itching and Rash   Current Outpatient Medications on File Prior to Visit  Medication Sig Dispense Refill   Blood Pressure Monitoring (BLOOD PRESSURE KIT) DEVI 1 Device by Does not apply route once a week. 1 each 0   Prenatal Vit-Fe Fumarate-FA (PRENATAL VITAMINS PO) Take 1 tablet by mouth daily.     No current facility-administered medications on file prior to visit.   Indications for ASA therapy (per uptodate) One of the following: Previous pregnancy with preeclampsia, especially early onset and with an adverse outcome No Multifetal gestation No Chronic hypertension No Type 1 or 2 diabetes mellitus No Chronic kidney disease No Autoimmune disease (antiphospholipid syndrome, systemic lupus erythematosus) No  Two or more of the following: Nulliparity Yes Obesity (body mass index >30 kg/m2) No Family history of preeclampsia in mother or sister No Age ?35 years No Sociodemographic characteristics  (African American race, low socioeconomic level) Yes Personal risk factors (eg, previous pregnancy with low birth weight or small for gestational age infant, previous adverse pregnancy outcome [eg, stillbirth], interval >10 years between pregnancies) No  Indications for early GDM screening  First-degree relative with diabetes No BMI >30kg/m2 No Age > 35 No Previous birth of an infant weighing ?4000 g No Gestational diabetes mellitus in a previous pregnancy No Glycated hemoglobin ?5.7 percent (39 mmol/mol), impaired glucose tolerance, or impaired fasting glucose on previous testing No High-risk race/ethnicity (eg, African American, Latino, Native American, [place] American, Pacific Islander) Yes Previous stillbirth of unknown cause No Maternal birthweight > 9 lbs No History of cardiovascular disease No Hypertension or on therapy for hypertension No High-density lipoprotein cholesterol level <35 mg/dL (1.61 mmol/L) and/or a triglyceride level >250 mg/dL (0.96 mmol/L) No Polycystic ovary syndrome No Physical inactivity No Other clinical condition associated with insulin resistance (eg, severe obesity, acanthosis nigricans) No Current use of glucocorticoids No   Early screening tests: not indicated today   Review of Systems Pertinent items noted in HPI and remainder of comprehensive ROS otherwise negative. Physical Exam:   Vitals:   11/12/22 1549  BP: 101/63  Pulse: 75  Weight: 153 lb 9.6 oz (69.7 kg)   Fetal Heart Rate (bpm): 173 Uterus:     Pelvic Exam: Perineum: no hemorrhoids, normal perineum  Vulva: normal external genitalia, no lesions   Vagina:  normal mucosa, normal discharge   Cervix: no lesions and normal, pap smear done.    Adnexa: normal adnexa and no mass, fullness, tenderness   Bony Pelvis: average  System: General: well-developed, well-nourished female in no acute distress   Breasts:  normal appearance, no masses or tenderness bilaterally   Skin: normal  coloration and turgor, no rashes   Neurologic: oriented, normal, negative, normal mood   Extremities: normal strength, tone, and muscle mass, ROM of all joints is normal   HEENT PERRLA, extraocular movement intact and sclera clear, anicteric   Mouth/Teeth mucous membranes moist, pharynx normal without lesions and dental hygiene good   Neck supple and no masses   Cardiovascular: regular rate and rhythm   Respiratory:  no respiratory distress, normal breath sounds   Abdomen: soft, non-tender; bowel sounds normal; no masses,  no organomegaly     Assessment:    Pregnancy: G1P0000 Patient Active Problem List   Diagnosis Date Noted   Supervision of other normal pregnancy, antepartum 11/10/2022     Plan:    1. Supervision of other normal pregnancy, antepartum - do not recommend ASA due to allergy  2. Initial obstetric visit in first trimester 3. [redacted] weeks gestation of pregnancy - Anticipatory guidance provided - Encouraged to review information provided in AVS - Discussed the importance of having access to MyChart for frequent communication  Lab Orders         Culture, OB Urine         CBC/D/Plt+RPR+Rh+ABO+RubIgG...         Panorama Prenatal Test Full Panel         HORIZON Basic Panel     Initial labs drawn. Continue prenatal vitamins. Genetic Screening discussed, First trimester screen and NIPS: ordered. Ultrasound discussed; fetal anatomic survey:  scheduled for 01/05/23 . Problem list reviewed and updated. The nature of [facility] with multiple MDs and other Advanced Practice Providers was explained to patient; also emphasized that residents, students are part of our team. Routine obstetric precautions reviewed. Return in about 4 weeks (around 12/10/2022) for LOB, IN-PERSON.    Corlis Hove, NP    Faculty Practice Center for [employer], [facility]

## 2022-11-13 LAB — CBC/D/PLT+RPR+RH+ABO+RUBIGG...
Antibody Screen: NEGATIVE
Basophils Absolute: 0 10*3/uL (ref 0.0–0.2)
Basos: 1 %
EOS (ABSOLUTE): 0.1 10*3/uL (ref 0.0–0.4)
Eos: 1 %
HCV Ab: NONREACTIVE
HIV Screen 4th Generation wRfx: NONREACTIVE
Hematocrit: 33.6 % — ABNORMAL LOW (ref 34.0–46.6)
Hemoglobin: 11.4 g/dL (ref 11.1–15.9)
Hepatitis B Surface Ag: NEGATIVE
Immature Grans (Abs): 0 10*3/uL (ref 0.0–0.1)
Immature Granulocytes: 0 %
Lymphocytes Absolute: 2.6 10*3/uL (ref 0.7–3.1)
Lymphs: 32 %
MCH: 31.8 pg (ref 26.6–33.0)
MCHC: 33.9 g/dL (ref 31.5–35.7)
MCV: 94 fL (ref 79–97)
Monocytes Absolute: 0.5 10*3/uL (ref 0.1–0.9)
Monocytes: 7 %
Neutrophils Absolute: 4.8 10*3/uL (ref 1.4–7.0)
Neutrophils: 59 %
Platelets: 134 10*3/uL — ABNORMAL LOW (ref 150–450)
RBC: 3.59 x10E6/uL — ABNORMAL LOW (ref 3.77–5.28)
RDW: 12.8 % (ref 11.7–15.4)
RPR Ser Ql: NONREACTIVE
Rh Factor: POSITIVE
Rubella Antibodies, IGG: 1.53 index (ref >0.99)
WBC: 8.1 10*3/uL (ref 3.4–10.8)

## 2022-11-13 LAB — HCV INTERPRETATION

## 2022-11-14 LAB — URINE CULTURE, OB REFLEX

## 2022-11-14 LAB — CULTURE, OB URINE

## 2022-11-17 ENCOUNTER — Encounter: Payer: Medicaid Other | Admitting: Advanced Practice Midwife

## 2022-11-17 LAB — CYTOLOGY - PAP: Diagnosis: NEGATIVE

## 2022-11-19 ENCOUNTER — Other Ambulatory Visit: Payer: Self-pay | Admitting: Student

## 2022-11-19 DIAGNOSIS — D696 Thrombocytopenia, unspecified: Secondary | ICD-10-CM

## 2022-11-21 LAB — HORIZON CUSTOM: REPORT SUMMARY: NEGATIVE

## 2022-11-22 LAB — PANORAMA PRENATAL TEST FULL PANEL:PANORAMA TEST PLUS 5 ADDITIONAL MICRODELETIONS: FETAL FRACTION: 6.2

## 2022-11-23 ENCOUNTER — Encounter: Payer: Self-pay | Admitting: Student

## 2022-11-23 DIAGNOSIS — D696 Thrombocytopenia, unspecified: Secondary | ICD-10-CM | POA: Insufficient documentation

## 2022-12-10 ENCOUNTER — Encounter: Payer: Medicaid Other | Admitting: Obstetrics and Gynecology

## 2022-12-10 ENCOUNTER — Institutional Professional Consult (permissible substitution): Payer: Self-pay | Admitting: Licensed Clinical Social Worker

## 2022-12-11 ENCOUNTER — Ambulatory Visit (INDEPENDENT_AMBULATORY_CARE_PROVIDER_SITE_OTHER): Payer: Medicaid Other | Admitting: Licensed Clinical Social Worker

## 2022-12-11 ENCOUNTER — Ambulatory Visit (INDEPENDENT_AMBULATORY_CARE_PROVIDER_SITE_OTHER): Payer: Medicaid Other

## 2022-12-11 VITALS — BP 98/58 | HR 73 | Wt 154.5 lb

## 2022-12-11 DIAGNOSIS — F4322 Adjustment disorder with anxiety: Secondary | ICD-10-CM

## 2022-12-11 DIAGNOSIS — Z3A15 15 weeks gestation of pregnancy: Secondary | ICD-10-CM

## 2022-12-11 DIAGNOSIS — Z3480 Encounter for supervision of other normal pregnancy, unspecified trimester: Secondary | ICD-10-CM

## 2022-12-11 NOTE — Progress Notes (Signed)
Pt reports not feeling any movement yet, denies pain today.

## 2022-12-11 NOTE — Patient Instructions (Addendum)
We highly recommend childbirth education to help you plan for labor and begin practicing coping skills (which will be needed with or without pain meds).  [place] Childbirth Education Options: Sign up by visiting [email or web address]  Childbirth ~ Self-Paced eClass (English and Spanish) This online class offers you the freedom to complete a childbirth education series in the comfort of your own home at your own pace.  Childbirth Class (In-Person 4-Week Series  or on Saturdays, Virtual 4-Week Series ~ [place]) This interactive in-person class series will help you and your partner prepare for your birth experience. Topics include: Labor & Birth, Comfort Measures, Breathing Techniques, Massage, Medical Interventions, Pain Management Options, Cesarean Birth, Postpartum Care, and Newborn Care  Comfort Techniques for Labor ~ In-Person Class ([place]) This interactive class is designed for parents-to-be who want to learn & practice hands-on skills to help relieve some of the discomfort of labor and encourage their babies to rotate toward the best position for birth. Moms and their partners will be able to try a variety of labor positions with birth balls and rebozos as well as practice breathing, relaxation, and visualization techniques.  Natural Childbirth Class (In-Person 5-Week Series, In-Person on Saturdays or Virtual 5-Week Series ~ [place]) This class series is designed for expectant parents who want to learn and practice natural methods of coping with the process of labor and childbirth.  Cesarean Birth Self-Paced eClass (English and Spanish) This online course provides comprehensive information you can trust as you prepare for a possible cesarean birth. In this class, you'll learn how to make your birth and recovery comfortable and joyful through instructive video clips, animations, and activities.  Waterbirth ~ Virtual Class Interested in a waterbirth? In addition to a consultation  with your credentialed waterbirth provider, this free, informational online class will help you discover whether waterbirth is the right fit for you. Not all obstetrical practices offer waterbirth, so check with your healthcare provider.  Tour (Self-Paced Video) - [name] [place] Watch our 4 minute video tour of [place] [facility] located in [place].   [place] Parenting Education Options:  Pregnancy 101 (Virtual) Congratulations on your pregnancy! This class is geared toward moms in their first trimester, but everyone is welcome. We are excited to guide you through all aspects of supporting a healthy pregnancy. You will learn what to expect at routine prenatal care appointments, common postpartum adjustments, basic infant safety, and breastfeeding.  Successful Partnering & Parenting ~ In-Person Workshop ([place]) This workshop inspires and equips partners of all economic levels, ages, and cultures to confidently care for their infants, support the birthing persons, and navigate their own transformations into new partners and parents. Learning activities are geared towards supporting partner, but moms are welcome to attend.  'Baby & Me' Parenting Group (Virtual on Wednesdays at 11am) Enjoy this time discussing newborn & infant parenting topics and family adjustment issues with other new parents in a relaxed environment. Each week brings a new speaker or baby-centered activity. This group offers support and connection to parents as they journey through the adjustments and struggles of that sometimes overwhelming first year after the birth of a child.  Baby Safety, CPR, & Choking Class ~ Virtual This life-saving information is meant to encourage parents as they learn important safety and prevention tips as well as infant CPR and relief of choking.  Breastfeeding Class (In-Person in [place] or Virtual) Families learn what to expect in the  first days and weeks of breastfeeding your   newborn. IF YOU ARE AN EMPLOYEE TAKING THIS CLASS FOR CREDIT, DO NOT register yourself. Please e-mail [email or web address].   Breastfeeding Self-Paced eClass (English & Spanish) Families learn what to expect in the first days and weeks of breastfeeding your newborn.  Caring for Baby ~ In-Person, Virtual or Self-Paced Class This in-person class is for both expectant and adoptive parents who want to learn and practice the most up-to-date newborn care for their babies. Focus is on birth through the first six weeks of life.  CPR & Choking Relief for Infants & Children ~ In-Person Class ([place]) This in-person course is designed for any parent, expectant parent, or adult who cares for infants or children. Participants learn and demonstrate cardiopulmonary resuscitation and choking relief procedures for both infants and children.  Grandparent Love ~ In-Person Class Grandparents will learn the most updated infant care and safety recommendations. They will discover ways to support their own children during the transition into the parenting role and receive tips on communicating with the new parents.  [place] Parenting Support Group Options:  Bereavement Grief Support Group (Pregnancy/Infant Loss) - Virtual This is an ongoing experience that meets once a month and is designed to help you honor the past, assist you in discovering tools to strengthen you today, and aid you in developing hope for the future.  Breastfeeding & Pumping Support Group (In-Person on Thursdays at 12pm or Virtual on Tuesdays at 5pm) Join us in-person each Thursday starting June 1st, 2023 at 12pm! This support group is free for all families looking for breastfeeding and/or pumping support.   Community-Based Childbirth Education Options:  [place] [facility] Classes:  Childbirth education classes can help you get ready for a positive parenting experience. You  can also meet other expectant parents and get free stuff for your baby. Each class runs for five weeks on the same night and costs $45 for the mother-to-be and her support person. Medicaid covers the cost if you are eligible. Call [phone] to register.  [facility] offers a variety of programs for the [place] community and is another great way to get connected. Please go to [email or web address] for more information.  Childbirth With A Twist! Be informed of your options, get educated on birth, understand what your body is doing, learn how to cope, and have a lot of fun and laughs all while doing it either from the comfort of your couch OR in our cozy office and classroom space near the [place] airport. If you are taking a virtual class, then class is taught LIVE, so you can ask questions and receive answers in real-time from an experienced doula and childbirth educator.  This virtual childbirth education class will meet for five instruction times online.  Although we are based in , [place], this virtual class is open to anyone in the world. Please visit: [email or web address] for more information.  Books We Love: The Doula Guide to Childbirth by Ananda Lowe and Rachel Zimmerman The First-Time Parent's Childbirth Handbook by Dr. Stephanie [place], CNM The Birth Partner by Penny Simkin    Considering Waterbirth? Guide for patients at [facility]) Why consider waterbirth? Gentle birth for babies  Less pain medicine used in labor  May allow for passive descent/less pushing  May reduce perineal tears  More mobility and instinctive maternal position changes  Increased maternal relaxation   Is waterbirth safe? What are the risks of infection, drowning or other complications? Infection:  Very low risk (3.7 % for tub vs   4.8% for bed)  7 in 8000 waterbirths with documented infection  Poorly cleaned equipment most  common cause  Slightly lower group B strep transmission rate  Drowning  Maternal:  Very low risk  Related to seizures or fainting  Newborn:  Very low risk. No evidence of increased risk of respiratory problems in multiple large studies  Physiological protection from breathing under water  Avoid underwater birth if there are any fetal complications  Once baby's head is out of the water, keep it out.  Birth complication  Some reports of cord trauma, but risk decreased by bringing baby to surface gradually  No evidence of increased risk of shoulder dystocia. Mothers can usually change positions faster in water than in a bed, possibly aiding the maneuvers to free the shoulder.   There are 2 things you MUST do to have a waterbirth with [facility]: Attend a waterbirth class at [facility] [place]   3rd Wednesday of every month from 7-9 pm (virtual during COVID) Free Register online at [email or web address] or [email or web address] or by calling [phone] Bring us the certificate from the class to your prenatal appointment or send via MyChart Meet with a midwife at 36 weeks* to see if you can still plan a waterbirth and to sign the consent.   *We also recommend that you schedule as many of your prenatal visits with a midwife as possible.    Helpful information: You may want to bring a bathing suit top to the hospital to wear during labor but this is optional.  All other supplies are provided by the hospital. Please arrive at the hospital with signs of active labor, and do not wait at home until late in labor. It takes 45 min- 1 hour for fetal monitoring, and check in to your room to take place, plus transport and filling of the waterbirth tub.    Things that would prevent you from having a waterbirth: Premature, <37wks  Previous cesarean birth  Presence of thick meconium-stained fluid  Multiple gestation (Twins, triplets, etc.)  Uncontrolled diabetes or gestational  diabetes requiring medication  Hypertension diagnosed in pregnancy or preexisting hypertension (gestational hypertension, preeclampsia, or chronic hypertension) Fetal growth restriction (your baby measures less than 10th percentile on ultrasound) Heavy vaginal bleeding  Non-reassuring fetal heart rate  Active infection (MRSA, etc.). Group B Strep is NOT a contraindication for waterbirth.  If your labor has to be induced and induction method requires continuous monitoring of the baby's heart rate  Other risks/issues identified by your obstetrical provider   Please remember that birth is unpredictable. Under certain unforeseeable circumstances your provider may advise against giving birth in the tub. These decisions will be made on a case-by-case basis and with the safety of you and your baby as our highest priority.    Updated 10/23/21   

## 2022-12-11 NOTE — Progress Notes (Signed)
   LOW-RISK PREGNANCY OFFICE VISIT  Patient name: Shelly Watson MRN [id number]  Date of birth: 1996-07-04 Chief Complaint:   Routine Prenatal Visit  Subjective:   Shelly Watson is a 27 y.o. G46P0000 female at [redacted]w[redacted]d with an Estimated Date of Delivery: 05/31/23 being seen today for ongoing management of a low-risk pregnancy aeb has Supervision of other normal pregnancy, antepartum and Benign gestational thrombocytopenia in second trimester [facility]) on their problem list.  Patient presents today, accompanied by her cousin, with no complaints.  Patient endorses fetal movement. Patient denies abdominal cramping or contractions.  Patient denies vaginal concerns including abnormal discharge, leaking of fluid, and bleeding. No issues with urination, constipation, or diarrhea.    Contractions: Not present. Vag. Bleeding: None.   .  Reviewed past medical,surgical, social, obstetrical and family history as well as problem list, medications and allergies.  Objective   Vitals:   12/11/22 1458  BP: (!) 98/58  Pulse: 73  Weight: 154 lb 8 oz (70.1 kg)  Body mass index is 26.52 kg/m.  Total Weight Gain:4 lb 8 oz (2.041 kg)         Physical Examination:   General appearance: Well appearing, and in no distress  Mental status: Alert, oriented to person, place, and time  Skin: Warm & dry  Cardiovascular: Normal heart rate noted  Respiratory: Normal respiratory effort, no distress  Abdomen: Not assessed  Pelvic: Cervical exam deferred           Extremities: Edema: None  Fetal Status: Fetal Heart Rate (bpm): 156      No results found for this or any previous visit (from the past 24 hour(s)).  Assessment & Plan:  Low-risk pregnancy of a 27 y.o., G1P0000 at [redacted]w[redacted]d with an Estimated Date of Delivery: 05/31/23   1. Supervision of other normal pregnancy, antepartum -Anticipatory guidance for upcoming appts. -Patient to schedule next appt in 4-5 weeks for an in-person visit.   2. [redacted] weeks gestation  of pregnancy -Doing well. -Encouraged enrollment into CB course. Information given. -Written information given for WB. Will discuss in detail at later date.  -AFP today      Meds: No orders of the defined types were placed in this encounter.  Labs/procedures today:  Lab Orders         AFP, Serum, Open Spina Bifida      Reviewed: Preterm labor symptoms and general obstetric precautions including but not limited to vaginal bleeding, contractions, leaking of fluid and fetal movement were reviewed in detail with the patient.  All questions were answered.  Follow-up: Return in about 4 weeks (around 01/08/2023) for LROB.  Orders Placed This Encounter  Procedures   AFP, Serum, Open Spina Bifida   Cherre Robins MSN, CNM 12/11/2022   Addendum 4:26 PM Patient left w/o collection of AFP. Patient called and informed that colleciton will occur at next appt on 6/20.  No questions or concerns.   Cherre Robins MSN, CNM Advanced Practice Provider, Center for [employer]

## 2022-12-16 ENCOUNTER — Telehealth: Payer: Self-pay

## 2022-12-16 NOTE — Telephone Encounter (Signed)
Written Order for Breast Pump signed and faxed to [facility].  

## 2022-12-17 NOTE — BH Specialist Note (Signed)
Integrated Behavioral Health Initial In-Person Visit  MRN: [id number] Name: Almarosa Cahoon  Number of Integrated Behavioral Health Clinician visits: 1 Session Start time:   2:50pm Session End time: 3:20pm Total time in minutes: 30 mins in person at [facility]   Types of Service: General Behavioral Integrated Care (BHI)  Interpretor:No. Interpretor Name and Language: none   Warm Hand Off Completed.        Subjective: Kileigh Erekson is a 27 y.o. female accompanied by  cousin Patient was referred by Sabas Sous CNM for new ob intro. Patient reports the following symptoms/concerns: adjustment  Duration of problem: approx one month; Severity of problem: mild  Objective: Mood: appropriate and Affect: Appropriate Risk of harm to self or others: No plan to harm self or others  Life Context: Family and Social: Lives in [place]  School/Work: n/a Self-Care: n/a Life Changes: new pregnancy  Patient and/or Family's Strengths/Protective Factors: Concrete supports in place (healthy food, safe environments, etc.)  Goals Addressed: Patient will: Reduce symptoms of: adjustment disorder Increase knowledge and/or ability of: coping skills  Demonstrate ability to: Increase adequate support systems for patient/family  Progress towards Goals: Ongoing  Interventions: Interventions utilized: Supportive Counseling  Standardized Assessments completed: Not Needed  Patient and/or Family Response: Ms. Wargel reports difficulty with adjustment to pregnancy. Ms. Lydia reports family is supportive however she does not have a relationship with father of baby. Ms. Dengler reports symptoms associated with anxiety such as loss of interest, trouble sleeping and fatigue.    Assessment: Patient currently experiencing adjustment disorder with anxiety.   Patient may benefit from integrated behavioral health.  Plan: Follow up with behavioral health clinician on : 01/08/23 Behavioral recommendations:  Prioritize rest, delegate task to prevent burnout, communicate need for added support, and deep breathing techniques. Referral(s): Integrated [employer] (In Clinic) "From scale of 1-10, how likely are you to follow plan?":  Gwyndolyn Saxon, LCSW

## 2023-01-05 ENCOUNTER — Ambulatory Visit: Payer: Medicaid Other | Admitting: *Deleted

## 2023-01-05 ENCOUNTER — Ambulatory Visit: Payer: Medicaid Other | Attending: Obstetrics and Gynecology

## 2023-01-05 ENCOUNTER — Other Ambulatory Visit: Payer: Self-pay | Admitting: Obstetrics and Gynecology

## 2023-01-05 ENCOUNTER — Encounter: Payer: Self-pay | Admitting: *Deleted

## 2023-01-05 VITALS — BP 102/59 | HR 79

## 2023-01-05 DIAGNOSIS — Z3A19 19 weeks gestation of pregnancy: Secondary | ICD-10-CM | POA: Diagnosis not present

## 2023-01-05 DIAGNOSIS — Z3480 Encounter for supervision of other normal pregnancy, unspecified trimester: Secondary | ICD-10-CM

## 2023-01-05 DIAGNOSIS — O35BXX0 Maternal care for other (suspected) fetal abnormality and damage, fetal cardiac anomalies, not applicable or unspecified: Secondary | ICD-10-CM

## 2023-01-05 DIAGNOSIS — O283 Abnormal ultrasonic finding on antenatal screening of mother: Secondary | ICD-10-CM

## 2023-01-05 DIAGNOSIS — D696 Thrombocytopenia, unspecified: Secondary | ICD-10-CM | POA: Diagnosis not present

## 2023-01-05 DIAGNOSIS — O99112 Other diseases of the blood and blood-forming organs and certain disorders involving the immune mechanism complicating pregnancy, second trimester: Secondary | ICD-10-CM | POA: Diagnosis not present

## 2023-01-05 DIAGNOSIS — Z363 Encounter for antenatal screening for malformations: Secondary | ICD-10-CM | POA: Insufficient documentation

## 2023-01-08 ENCOUNTER — Encounter: Payer: Medicaid Other | Admitting: Obstetrics

## 2023-01-29 ENCOUNTER — Ambulatory Visit (INDEPENDENT_AMBULATORY_CARE_PROVIDER_SITE_OTHER): Payer: Medicaid Other | Admitting: Student

## 2023-01-29 ENCOUNTER — Encounter: Payer: Self-pay | Admitting: Student

## 2023-01-29 VITALS — BP 102/69 | HR 54 | Wt 162.4 lb

## 2023-01-29 DIAGNOSIS — O99111 Other diseases of the blood and blood-forming organs and certain disorders involving the immune mechanism complicating pregnancy, first trimester: Secondary | ICD-10-CM

## 2023-01-29 DIAGNOSIS — O99112 Other diseases of the blood and blood-forming organs and certain disorders involving the immune mechanism complicating pregnancy, second trimester: Secondary | ICD-10-CM

## 2023-01-29 DIAGNOSIS — D696 Thrombocytopenia, unspecified: Secondary | ICD-10-CM

## 2023-01-29 DIAGNOSIS — Z3480 Encounter for supervision of other normal pregnancy, unspecified trimester: Secondary | ICD-10-CM

## 2023-01-29 DIAGNOSIS — Z3A22 22 weeks gestation of pregnancy: Secondary | ICD-10-CM

## 2023-01-29 NOTE — Progress Notes (Signed)
   PRENATAL VISIT NOTE  Subjective:  Shelly Watson is a 27 y.o. G1P0000 at [redacted]w[redacted]d being seen today for ongoing prenatal care.  She is currently monitored for the following issues for this low-risk pregnancy and has Supervision of other normal pregnancy, antepartum and Benign gestational thrombocytopenia in second trimester [facility]) on their problem list.  Patient reports no complaints.  Contractions: Not present. Vag. Bleeding: None.  Movement: Present. Denies leaking of fluid.   The following portions of the patient's history were reviewed and updated as appropriate: allergies, current medications, past family history, past medical history, past social history, past surgical history and problem list.   Objective:   Vitals:   01/29/23 1131  BP: 102/69  Pulse: (!) 54  Weight: 162 lb 6.4 oz (73.7 kg)    Fetal Status: Fetal Heart Rate (bpm): 143   Movement: Present     General:  Alert, oriented and cooperative. Patient is in no acute distress.  Skin: Skin is warm and dry. No rash noted.   Cardiovascular: Normal heart rate noted  Respiratory: Normal respiratory effort, no problems with respiration noted  Abdomen: Soft, gravid, appropriate for gestational age.  Pain/Pressure: Absent     Pelvic: Cervical exam deferred        Extremities: Normal range of motion.  Edema: Trace  Mental Status: Normal mood and affect. Normal behavior. Normal judgment and thought content.   Assessment and Plan:  Pregnancy: G1P0000 at [redacted]w[redacted]d 1. Supervision of other normal pregnancy, antepartum - doing well overall - + fetal movement  2. [redacted] weeks gestation of pregnancy - Plan for gtt/tdap at next visit - AFP, Serum, Open Spina Bifida  3. Benign gestational thrombocytopenia in first trimester [facility]) - Will re-check CBC at next visit   Preterm labor symptoms and general obstetric precautions including but not limited to vaginal bleeding, contractions, leaking of fluid and fetal movement were reviewed in  detail with the patient. Please refer to After Visit Summary for other counseling recommendations.   No follow-ups on file.  Future Appointments  Date Time Provider Department Center  02/27/2023  9:00 AM [facility] None  02/27/2023  9:35 AM Lennart Pall, MD [facility] None    Corlis Hove, NP

## 2023-01-29 NOTE — Progress Notes (Signed)
Pt presents for ROB visit. Pt c/o constipation. No other concerns.

## 2023-01-31 LAB — AFP, SERUM, OPEN SPINA BIFIDA
AFP MoM: 1.69
AFP Value: 135 ng/mL
Gest. Age on Collection Date: 22 weeks
Maternal Age At EDD: 25.2 yr
OSBR Risk 1 IN: 3342
Test Results:: NEGATIVE
Weight: 162 [lb_av]

## 2023-02-19 DIAGNOSIS — O24419 Gestational diabetes mellitus in pregnancy, unspecified control: Secondary | ICD-10-CM

## 2023-02-19 HISTORY — DX: Gestational diabetes mellitus in pregnancy, unspecified control: O24.419

## 2023-02-27 ENCOUNTER — Other Ambulatory Visit: Payer: Medicaid Other

## 2023-02-27 ENCOUNTER — Ambulatory Visit (INDEPENDENT_AMBULATORY_CARE_PROVIDER_SITE_OTHER): Payer: Medicaid Other | Admitting: Obstetrics and Gynecology

## 2023-02-27 VITALS — BP 102/66 | HR 83 | Wt 174.0 lb

## 2023-02-27 DIAGNOSIS — Z1339 Encounter for screening examination for other mental health and behavioral disorders: Secondary | ICD-10-CM

## 2023-02-27 DIAGNOSIS — O24419 Gestational diabetes mellitus in pregnancy, unspecified control: Secondary | ICD-10-CM

## 2023-02-27 DIAGNOSIS — O99112 Other diseases of the blood and blood-forming organs and certain disorders involving the immune mechanism complicating pregnancy, second trimester: Secondary | ICD-10-CM | POA: Diagnosis not present

## 2023-02-27 DIAGNOSIS — Z3A26 26 weeks gestation of pregnancy: Secondary | ICD-10-CM | POA: Diagnosis not present

## 2023-02-27 DIAGNOSIS — D696 Thrombocytopenia, unspecified: Secondary | ICD-10-CM

## 2023-02-27 DIAGNOSIS — D649 Anemia, unspecified: Secondary | ICD-10-CM

## 2023-02-27 DIAGNOSIS — Z3480 Encounter for supervision of other normal pregnancy, unspecified trimester: Secondary | ICD-10-CM

## 2023-02-27 NOTE — Progress Notes (Addendum)
ROB/GTT. 

## 2023-02-27 NOTE — Progress Notes (Signed)
   PRENATAL VISIT NOTE  Subjective:  Shelly Watson is a 27 y.o. G1P0000 at [redacted]w[redacted]d being seen today for ongoing prenatal care.  She is currently monitored for the following issues for this low-risk pregnancy and has Supervision of other normal pregnancy, antepartum and Benign gestational thrombocytopenia in second trimester [facility]) on their problem list.  Patient reports no complaints.  Contractions: Not present. Vag. Bleeding: None.  Movement: Present. Denies leaking of fluid.   The following portions of the patient's history were reviewed and updated as appropriate: allergies, current medications, past family history, past medical history, past social history, past surgical history and problem list.   Objective:   Vitals:   02/27/23 0931  BP: 102/66  Pulse: 83  Weight: 174 lb (78.9 kg)    Fetal Status: Fetal Heart Rate (bpm): 150 Fundal Height: 27 cm Movement: Present     General:  Alert, oriented and cooperative. Patient is in no acute distress.  Skin: Skin is warm and dry. No rash noted.   Cardiovascular: Normal heart rate noted  Respiratory: Normal respiratory effort, no problems with respiration noted  Abdomen: Soft, gravid, appropriate for gestational age.  Pain/Pressure: Absent      Assessment and Plan:  Pregnancy: G1P0000 at [redacted]w[redacted]d 1. Supervision of other normal pregnancy, antepartum 2. [redacted] weeks gestation of pregnancy Discussed tdap next appt - Glucose Tolerance, 2 Hours w/1 Hour - RPR - CBC - HIV antibody (with reflex)  3. Thrombocytopenia affecting pregnancy (HCC) NOB plt 132, f/u plt on CBC today  Please refer to After Visit Summary for other counseling recommendations.   Return in about 3 weeks (around 03/20/2023) for return OB at 29 weeks - CNM preferred.  No future appointments.  Lennart Pall, MD

## 2023-03-01 DIAGNOSIS — D649 Anemia, unspecified: Secondary | ICD-10-CM | POA: Insufficient documentation

## 2023-03-01 DIAGNOSIS — O24415 Gestational diabetes mellitus in pregnancy, controlled by oral hypoglycemic drugs: Secondary | ICD-10-CM | POA: Insufficient documentation

## 2023-03-01 DIAGNOSIS — O99013 Anemia complicating pregnancy, third trimester: Secondary | ICD-10-CM | POA: Insufficient documentation

## 2023-03-01 DIAGNOSIS — O24419 Gestational diabetes mellitus in pregnancy, unspecified control: Secondary | ICD-10-CM | POA: Insufficient documentation

## 2023-03-01 MED ORDER — FERROUS SULFATE 325 (65 FE) MG PO TABS: 325.0000 mg | ORAL_TABLET | ORAL | 1 refills | Status: DC

## 2023-03-01 NOTE — Addendum Note (Signed)
Addended by: Harvie Bridge on: 03/01/2023 04:47 PM   Modules accepted: Orders

## 2023-03-18 ENCOUNTER — Encounter: Payer: Medicaid Other | Attending: Obstetrics and Gynecology | Admitting: Dietician

## 2023-03-18 DIAGNOSIS — O24415 Gestational diabetes mellitus in pregnancy, controlled by oral hypoglycemic drugs: Secondary | ICD-10-CM | POA: Diagnosis present

## 2023-03-23 ENCOUNTER — Encounter: Payer: Self-pay | Admitting: Dietician

## 2023-03-23 NOTE — Progress Notes (Signed)
Patient was seen on 03/18/2023 for Gestational Diabetes self-management class at the [facility]. The following learning objectives were met by the patient during this course:  States the definition of Gestational Diabetes States why dietary management is important in controlling blood glucose Describes the effects each nutrient has on blood glucose levels Demonstrates ability to create a balanced meal plan Demonstrates carbohydrate counting  States when to check blood glucose levels Demonstrates proper blood glucose monitoring techniques States the effect of stress and exercise on blood glucose levels States the importance of limiting caffeine and abstaining from alcohol and smoking  Blood glucose monitor given: AccuChek Guide Me Lot # [id number] Exp: 02/06/2023 Blood glucose reading: 92  Patient instructed to monitor glucose levels: FBS: 60 - <90 1 hour: <140 2 hour: <120  *Patient received handouts: Nutrition Diabetes and Pregnancy Carbohydrate Counting List  Patient will be seen for follow-up as needed.

## 2023-03-25 ENCOUNTER — Ambulatory Visit (INDEPENDENT_AMBULATORY_CARE_PROVIDER_SITE_OTHER): Payer: Medicaid Other | Admitting: Advanced Practice Midwife

## 2023-03-25 VITALS — BP 106/65 | HR 107 | Wt 176.4 lb

## 2023-03-25 DIAGNOSIS — O24410 Gestational diabetes mellitus in pregnancy, diet controlled: Secondary | ICD-10-CM

## 2023-03-25 DIAGNOSIS — Z3A30 30 weeks gestation of pregnancy: Secondary | ICD-10-CM

## 2023-03-25 DIAGNOSIS — Z3480 Encounter for supervision of other normal pregnancy, unspecified trimester: Secondary | ICD-10-CM

## 2023-03-25 DIAGNOSIS — D696 Thrombocytopenia, unspecified: Secondary | ICD-10-CM

## 2023-03-25 DIAGNOSIS — O99119 Other diseases of the blood and blood-forming organs and certain disorders involving the immune mechanism complicating pregnancy, unspecified trimester: Secondary | ICD-10-CM

## 2023-03-25 NOTE — Progress Notes (Signed)
   PRENATAL VISIT NOTE  Subjective:  Shelly Watson is a 27 y.o. G1P0000 at [redacted]w[redacted]d being seen today for ongoing prenatal care.  She is currently monitored for the following issues for this low-risk pregnancy and has Supervision of other normal pregnancy, antepartum; Benign gestational thrombocytopenia in second trimester [facility]); GDM (gestational diabetes mellitus); and Anemia on their problem list.  Patient reports no complaints.  Contractions: Not present. Vag. Bleeding: None.  Movement: Present. Denies leaking of fluid.   The following portions of the patient's history were reviewed and updated as appropriate: allergies, current medications, past family history, past medical history, past social history, past surgical history and problem list.   Objective:   Vitals:   03/25/23 1455  BP: 106/65  Pulse: (!) 107  Weight: 176 lb 6.4 oz (80 kg)    Fetal Status: Fetal Heart Rate (bpm): 150   Movement: Present     General:  Alert, oriented and cooperative. Patient is in no acute distress.  Skin: Skin is warm and dry. No rash noted.   Cardiovascular: Normal heart rate noted  Respiratory: Normal respiratory effort, no problems with respiration noted  Abdomen: Soft, gravid, appropriate for gestational age.  Pain/Pressure: Absent     Pelvic: Cervical exam deferred        Extremities: Normal range of motion.  Edema: Trace  Mental Status: Normal mood and affect. Normal behavior. Normal judgment and thought content.   Assessment and Plan:  Pregnancy: G1P0000 at [redacted]w[redacted]d 1. Supervision of other normal pregnancy, antepartum --Anticipatory guidance about next visits/weeks of pregnancy given.   2. [redacted] weeks gestation of pregnancy   3. Thrombocytopenia affecting pregnancy (HCC) --Plts 131 on 8/9, recheck at 36 weeks  4. Diet controlled gestational diabetes mellitus (GDM) in third trimester --Pt forgot paper log but reports all fasting values are below 95 and all PP are below 120.  Pt to take a  screen shot and sent her glucose log via Mychart today.   Preterm labor symptoms and general obstetric precautions including but not limited to vaginal bleeding, contractions, leaking of fluid and fetal movement were reviewed in detail with the patient. Please refer to After Visit Summary for other counseling recommendations.   No follow-ups on file.  Future Appointments  Date Time Provider Department Center  04/08/2023  2:50 PM Shelly Lush, FNP [facility] None  04/23/2023  2:50 PM Shelly Watson, CNM [facility] None    Sharen Counter, CNM

## 2023-03-30 ENCOUNTER — Inpatient Hospital Stay (HOSPITAL_COMMUNITY)
Admission: AD | Admit: 2023-03-30 | Discharge: 2023-03-30 | Discharge status: Against Medical Advice | Payer: Medicaid Other | Attending: Obstetrics and Gynecology | Admitting: Obstetrics and Gynecology

## 2023-03-30 DIAGNOSIS — R102 Pelvic and perineal pain: Secondary | ICD-10-CM | POA: Diagnosis present

## 2023-03-30 DIAGNOSIS — R112 Nausea with vomiting, unspecified: Secondary | ICD-10-CM | POA: Insufficient documentation

## 2023-03-30 DIAGNOSIS — D696 Thrombocytopenia, unspecified: Secondary | ICD-10-CM

## 2023-03-30 DIAGNOSIS — Z3480 Encounter for supervision of other normal pregnancy, unspecified trimester: Secondary | ICD-10-CM

## 2023-03-30 LAB — URINALYSIS, ROUTINE W REFLEX MICROSCOPIC
Bilirubin Urine: NEGATIVE
Glucose, UA: NEGATIVE mg/dL
Hgb urine dipstick: NEGATIVE
Ketones, ur: NEGATIVE mg/dL
Leukocytes,Ua: NEGATIVE
Nitrite: NEGATIVE
Protein, ur: NEGATIVE mg/dL
Specific Gravity, Urine: 1.01 (ref 1.005–1.030)
pH: 7 (ref 5.0–8.0)

## 2023-03-30 MED ORDER — ONDANSETRON 4 MG PO TBDP: 4.0000 mg | ORAL_TABLET | Freq: Once | ORAL | Status: DC

## 2023-03-30 MED ORDER — SCOPOLAMINE 1 MG/3DAYS TD PT72: 1.0000 | MEDICATED_PATCH | Freq: Once | TRANSDERMAL | Status: DC

## 2023-03-30 NOTE — Progress Notes (Signed)
2 attempts at calling patient. No answer for either attempt.   Marylee Belzer Danella Deis) Suzie Portela, MSN, CNM  Center for [facility]  03/30/2023 9:44 PM

## 2023-03-30 NOTE — MAU Note (Signed)
.  Shelly Watson is a 27 y.o. at [redacted]w[redacted]d here in MAU reporting: unable to keep anything down for the last x2 days. Also reports shooting pelvic pain. Denies VB or LOF. +FM. States it hurts when "the baby balls up under my ribs".   Pain score: 5 Vitals:   03/30/23 1739  BP: 115/71  Pulse: 92  Resp: 14  Temp: 98.5 F (36.9 C)  SpO2: 97%     FHT:152 Lab orders placed from triage: UA

## 2023-04-08 ENCOUNTER — Ambulatory Visit (INDEPENDENT_AMBULATORY_CARE_PROVIDER_SITE_OTHER): Payer: Medicaid Other | Admitting: Obstetrics and Gynecology

## 2023-04-08 ENCOUNTER — Encounter: Payer: Self-pay | Admitting: Obstetrics and Gynecology

## 2023-04-08 VITALS — BP 97/63 | HR 85 | Wt 176.0 lb

## 2023-04-08 DIAGNOSIS — Z3A32 32 weeks gestation of pregnancy: Secondary | ICD-10-CM

## 2023-04-08 DIAGNOSIS — O99119 Other diseases of the blood and blood-forming organs and certain disorders involving the immune mechanism complicating pregnancy, unspecified trimester: Secondary | ICD-10-CM

## 2023-04-08 DIAGNOSIS — Z3480 Encounter for supervision of other normal pregnancy, unspecified trimester: Secondary | ICD-10-CM

## 2023-04-08 DIAGNOSIS — D696 Thrombocytopenia, unspecified: Secondary | ICD-10-CM

## 2023-04-08 DIAGNOSIS — O24410 Gestational diabetes mellitus in pregnancy, diet controlled: Secondary | ICD-10-CM

## 2023-04-08 NOTE — Addendum Note (Signed)
Addended by: Sue Lush on: 04/08/2023 03:24 PM   Modules accepted: Orders

## 2023-04-08 NOTE — Progress Notes (Signed)
Pt presents for ROB visit. No concerns.

## 2023-04-08 NOTE — Progress Notes (Signed)
   PRENATAL VISIT NOTE  Subjective:  Shelly Watson is a 27 y.o. G1P0000 at [redacted]w[redacted]d being seen today for ongoing prenatal care.  She is currently monitored for the following issues for this low-risk pregnancy and has Supervision of other normal pregnancy, antepartum; Benign gestational thrombocytopenia in second trimester [facility]); GDM (gestational diabetes mellitus); and Anemia on their problem list.  Patient reports no complaints.  Contractions: Not present. Vag. Bleeding: None.  Movement: Present. Denies leaking of fluid.   The following portions of the patient's history were reviewed and updated as appropriate: allergies, current medications, past family history, past medical history, past social history, past surgical history and problem list.   Objective:   Vitals:   04/08/23 1457  BP: 97/63  Pulse: 85  Weight: 176 lb (79.8 kg)    Fetal Status: Fetal Heart Rate (bpm): 156 Fundal Height: 33 cm Movement: Present     General:  Alert, oriented and cooperative. Patient is in no acute distress.  Skin: Skin is warm and dry. No rash noted.   Cardiovascular: Normal heart rate noted  Respiratory: Normal respiratory effort, no problems with respiration noted  Abdomen: Soft, gravid, appropriate for gestational age.  Pain/Pressure: Absent     Pelvic: Cervical exam deferred        Extremities: Normal range of motion.  Edema: Trace  Mental Status: Normal mood and affect. Normal behavior. Normal judgment and thought content.   Assessment and Plan:  Pregnancy: G1P0000 at [redacted]w[redacted]d 1. Supervision of other normal pregnancy, antepartum BP and fhr normal  Feeling regular fetal movement   2. Thrombocytopenia affecting pregnancy (HCC) Recheck 36 weeks 8/7 plts 131  3. Diet controlled gestational diabetes mellitus (GDM) in third trimester Forgot to bring log, discussed importance of bringing to determine need for meds or timing of delivery, will send a picture of numbers when she gets home Reports  fasting 92-93, pp 101-110 Ordered follow up growth u/s today   4. [redacted] weeks gestation of pregnancy Anticipatory guidance regarding upcoming appointments  Preterm labor symptoms and general obstetric precautions including but not limited to vaginal bleeding, contractions, leaking of fluid and fetal movement were reviewed in detail with the patient. Please refer to After Visit Summary for other counseling recommendations.   Return in one week for routine prenatal   Future Appointments  Date Time Provider Department Center  04/23/2023  2:50 PM Raelyn Mora, CNM [facility] None  [facility]  Albertine Grates, FNP

## 2023-04-22 ENCOUNTER — Other Ambulatory Visit: Payer: Self-pay | Admitting: *Deleted

## 2023-04-22 ENCOUNTER — Other Ambulatory Visit: Payer: Self-pay | Admitting: Obstetrics and Gynecology

## 2023-04-22 ENCOUNTER — Ambulatory Visit: Payer: Medicaid Other | Admitting: *Deleted

## 2023-04-22 ENCOUNTER — Ambulatory Visit: Payer: Medicaid Other | Attending: Obstetrics and Gynecology

## 2023-04-22 VITALS — BP 107/68 | HR 89

## 2023-04-22 DIAGNOSIS — Z362 Encounter for other antenatal screening follow-up: Secondary | ICD-10-CM | POA: Diagnosis not present

## 2023-04-22 DIAGNOSIS — D696 Thrombocytopenia, unspecified: Secondary | ICD-10-CM | POA: Diagnosis not present

## 2023-04-22 DIAGNOSIS — O99112 Other diseases of the blood and blood-forming organs and certain disorders involving the immune mechanism complicating pregnancy, second trimester: Secondary | ICD-10-CM | POA: Insufficient documentation

## 2023-04-22 DIAGNOSIS — O358XX0 Maternal care for other (suspected) fetal abnormality and damage, not applicable or unspecified: Secondary | ICD-10-CM | POA: Insufficient documentation

## 2023-04-22 DIAGNOSIS — Z3A34 34 weeks gestation of pregnancy: Secondary | ICD-10-CM | POA: Diagnosis not present

## 2023-04-22 DIAGNOSIS — O24419 Gestational diabetes mellitus in pregnancy, unspecified control: Secondary | ICD-10-CM

## 2023-04-22 DIAGNOSIS — Z3480 Encounter for supervision of other normal pregnancy, unspecified trimester: Secondary | ICD-10-CM

## 2023-04-22 DIAGNOSIS — O24410 Gestational diabetes mellitus in pregnancy, diet controlled: Secondary | ICD-10-CM | POA: Diagnosis present

## 2023-04-22 DIAGNOSIS — O35BXX0 Maternal care for other (suspected) fetal abnormality and damage, fetal cardiac anomalies, not applicable or unspecified: Secondary | ICD-10-CM | POA: Diagnosis not present

## 2023-04-22 DIAGNOSIS — O99113 Other diseases of the blood and blood-forming organs and certain disorders involving the immune mechanism complicating pregnancy, third trimester: Secondary | ICD-10-CM | POA: Diagnosis not present

## 2023-04-23 ENCOUNTER — Encounter: Payer: Self-pay | Admitting: Obstetrics and Gynecology

## 2023-04-23 ENCOUNTER — Ambulatory Visit (INDEPENDENT_AMBULATORY_CARE_PROVIDER_SITE_OTHER): Payer: Medicaid Other | Admitting: Obstetrics and Gynecology

## 2023-04-23 VITALS — BP 108/72 | HR 120 | Wt 181.3 lb

## 2023-04-23 DIAGNOSIS — O0993 Supervision of high risk pregnancy, unspecified, third trimester: Secondary | ICD-10-CM

## 2023-04-23 DIAGNOSIS — Z3A34 34 weeks gestation of pregnancy: Secondary | ICD-10-CM

## 2023-04-23 DIAGNOSIS — O24410 Gestational diabetes mellitus in pregnancy, diet controlled: Secondary | ICD-10-CM

## 2023-04-23 NOTE — Patient Instructions (Signed)

## 2023-04-27 ENCOUNTER — Encounter (HOSPITAL_COMMUNITY): Payer: Self-pay | Admitting: Obstetrics and Gynecology

## 2023-04-27 ENCOUNTER — Inpatient Hospital Stay (HOSPITAL_COMMUNITY)
Admission: AD | Admit: 2023-04-27 | Discharge: 2023-04-27 | Disposition: A | Discharge status: Home / Self Care | Payer: Medicaid Other | Attending: Obstetrics and Gynecology | Admitting: Obstetrics and Gynecology

## 2023-04-27 DIAGNOSIS — R0602 Shortness of breath: Secondary | ICD-10-CM | POA: Insufficient documentation

## 2023-04-27 DIAGNOSIS — R000 Tachycardia, unspecified: Secondary | ICD-10-CM | POA: Insufficient documentation

## 2023-04-27 DIAGNOSIS — O4700 False labor before 37 completed weeks of gestation, unspecified trimester: Secondary | ICD-10-CM

## 2023-04-27 DIAGNOSIS — O26893 Other specified pregnancy related conditions, third trimester: Secondary | ICD-10-CM | POA: Diagnosis present

## 2023-04-27 DIAGNOSIS — R071 Chest pain on breathing: Secondary | ICD-10-CM | POA: Insufficient documentation

## 2023-04-27 DIAGNOSIS — O99413 Diseases of the circulatory system complicating pregnancy, third trimester: Secondary | ICD-10-CM | POA: Diagnosis not present

## 2023-04-27 DIAGNOSIS — O4703 False labor before 37 completed weeks of gestation, third trimester: Secondary | ICD-10-CM | POA: Insufficient documentation

## 2023-04-27 DIAGNOSIS — Z3A35 35 weeks gestation of pregnancy: Secondary | ICD-10-CM | POA: Diagnosis not present

## 2023-04-27 DIAGNOSIS — M545 Low back pain, unspecified: Secondary | ICD-10-CM | POA: Diagnosis not present

## 2023-04-27 LAB — URINALYSIS, ROUTINE W REFLEX MICROSCOPIC
Bilirubin Urine: NEGATIVE
Glucose, UA: NEGATIVE mg/dL
Hgb urine dipstick: NEGATIVE
Ketones, ur: NEGATIVE mg/dL
Nitrite: NEGATIVE
Protein, ur: NEGATIVE mg/dL
Specific Gravity, Urine: 1.008 (ref 1.005–1.030)
pH: 7 (ref 5.0–8.0)

## 2023-04-27 LAB — CBC
HCT: 29.1 % — ABNORMAL LOW (ref 36.0–46.0)
Hemoglobin: 10 g/dL — ABNORMAL LOW (ref 12.0–15.0)
MCH: 31.5 pg (ref 26.0–34.0)
MCHC: 34.4 g/dL (ref 30.0–36.0)
MCV: 91.8 fL (ref 80.0–100.0)
Platelets: 134 10*3/uL — ABNORMAL LOW (ref 150–400)
RBC: 3.17 MIL/uL — ABNORMAL LOW (ref 3.87–5.11)
RDW: 13.5 % (ref 11.5–15.5)
WBC: 12 10*3/uL — ABNORMAL HIGH (ref 4.0–10.5)
nRBC: 0 % (ref 0.0–0.2)

## 2023-04-27 LAB — COMPREHENSIVE METABOLIC PANEL
ALT: 10 U/L (ref 0–44)
AST: 15 U/L (ref 15–41)
Albumin: 3 g/dL — ABNORMAL LOW (ref 3.5–5.0)
Alkaline Phosphatase: 86 U/L (ref 38–126)
Anion gap: 12 (ref 5–15)
BUN: 5 mg/dL — ABNORMAL LOW (ref 6–20)
CO2: 21 mmol/L — ABNORMAL LOW (ref 22–32)
Calcium: 10.1 mg/dL (ref 8.9–10.3)
Chloride: 100 mmol/L (ref 98–111)
Creatinine, Ser: 0.7 mg/dL (ref 0.44–1.00)
GFR, Estimated: >60 mL/min (ref >60)
Glucose, Bld: 87 mg/dL (ref 70–99)
Potassium: 3.3 mmol/L — ABNORMAL LOW (ref 3.5–5.1)
Sodium: 133 mmol/L — ABNORMAL LOW (ref 135–145)
Total Bilirubin: 0.2 mg/dL — ABNORMAL LOW (ref 0.3–1.2)
Total Protein: 6.9 g/dL (ref 6.5–8.1)

## 2023-04-27 LAB — BRAIN NATRIURETIC PEPTIDE: B Natriuretic Peptide: 72.1 pg/mL (ref 0.0–100.0)

## 2023-04-27 LAB — TROPONIN I (HIGH SENSITIVITY): Troponin I (High Sensitivity): 7 ng/L (ref <18)

## 2023-04-27 MED ORDER — CYCLOBENZAPRINE HCL 5 MG PO TABS
10.0000 mg | ORAL_TABLET | Freq: Once | ORAL | Status: DC
  Filled 2023-04-27: qty 2

## 2023-04-27 MED ORDER — ACETAMINOPHEN 500 MG PO TABS
1000.0000 mg | ORAL_TABLET | Freq: Once | ORAL | Status: DC
  Filled 2023-04-27: qty 2

## 2023-04-27 NOTE — MAU Provider Note (Signed)
Obstetric Attending MAU Note  Chief Complaint:  chest pain   HPI: Shelly Watson is a 27 y.o. G1P0000 at [redacted]w[redacted]d who presents to maternity admissions reporting left sided chest pain, some SOB, low back pain, buttock pain. Denies fever, chills, recent uri sx's. . Denies contractions, leakage of fluid or vaginal bleeding. Good fetal movement.   Pregnancy Course: Receives care at [facility]  Patient Active Problem List   Diagnosis Date Noted   GDM (gestational diabetes mellitus) 03/01/2023   Anemia 03/01/2023   Benign gestational thrombocytopenia in second trimester (HCC) 11/23/2022   Supervision of other normal pregnancy, antepartum 11/10/2022    Past Medical History:  Diagnosis Date   GDM (gestational diabetes mellitus) 02/2023   No pertinent past medical history    Strep throat     OB History  Gravida Para Term Preterm AB Living  1 0 0 0 0 0  SAB IAB Ectopic Multiple Live Births  0 0 0 0 0    # Outcome Date GA Lbr Len/2nd Weight Sex Type Anes PTL Lv  1 Current             Past Surgical History:  Procedure Laterality Date   NO PAST SURGERIES      Family History: History reviewed. No pertinent family history.  Social History: Social History   Tobacco Use   Smoking status: Never    Passive exposure: Never   Smokeless tobacco: Never  Vaping Use   Vaping status: Never Used  Substance Use Topics   Alcohol use: No   Drug use: No    Allergies:  Allergies  Allergen Reactions   Aspirin Itching and Rash    Medications Prior to Admission  Medication Sig Dispense Refill Last Dose   Blood Pressure Monitoring (BLOOD PRESSURE KIT) DEVI 1 Device by Does not apply route once a week. 1 each 0 Past Week   Prenatal Vit-Fe Fumarate-FA (PRENATAL VITAMINS PO) Take 1 tablet by mouth daily.   04/26/2023   ferrous sulfate 325 (65 FE) MG tablet Take 1 tablet (325 mg total) by mouth every other day. (Patient not taking: Reported on 03/25/2023) 90 tablet 1 More than a month    ROS:  Pertinent findings in history of present illness.  Physical Exam  Blood pressure 118/79, pulse 98, temperature 98.1 F (36.7 C), temperature source Oral, resp. rate (!) 22, height 5\' 5"  (1.651 m), weight 82.2 kg, last menstrual period 08/24/2022, SpO2 99%. CONSTITUTIONAL: Well-developed, well-nourished female in no acute distress.  HENT:  Normocephalic, atraumatic, External right and left ear normal.  EYES: Conjunctivae and EOM are normal.  No scleral icterus.  NECK: Normal range of motion, supple, no masses SKIN: Skin is warm and dry. No rash noted. Not diaphoretic. No erythema. No pallor. NEUROLGIC: Alert and oriented to person, place, and time. CARDIOVASCULAR: Normal heart rate noted, regular rhythm RESPIRATORY: Effort and breath sounds normal, no problems with respiration noted ABDOMEN: Soft, nontender, nondistended, gravid appropriate for gestational age MUSCULOSKELETAL: Normal range of motion. No edema and no tenderness. 2+ distal pulses.  SPECULUM EXAM: NEFG, physiologic discharge, no blood, cervix clean Dilation: Fingertip Effacement (%): 90 Station: -1 Exam by:: Dr. Shawnie Pons  FHT:  Baseline 140 , moderate variability, accelerations present, no decelerations Contractions: q 2-3 mins   Labs: Results for orders placed or performed during the Watson encounter of 04/27/23 (from the past 24 hour(s))  CBC     Status: Abnormal   Collection Time: 04/27/23  9:04 PM  Result Value Ref Range  WBC 12.0 (H) 4.0 - 10.5 K/uL   RBC 3.17 (L) 3.87 - 5.11 MIL/uL   Hemoglobin 10.0 (L) 12.0 - 15.0 g/dL   HCT 16.1 (L) 09.6 - 04.5 %   MCV 91.8 80.0 - 100.0 fL   MCH 31.5 26.0 - 34.0 pg   MCHC 34.4 30.0 - 36.0 g/dL   RDW 40.9 81.1 - 91.4 %   Platelets 134 (L) 150 - 400 K/uL   nRBC 0.0 0.0 - 0.2 %  Urinalysis, Routine w reflex microscopic -Urine, Clean Catch     Status: Abnormal   Collection Time: 04/27/23  9:26 PM  Result Value Ref Range   Color, Urine YELLOW YELLOW   APPearance HAZY (A)  CLEAR   Specific Gravity, Urine 1.008 1.005 - 1.030   pH 7.0 5.0 - 8.0   Glucose, UA NEGATIVE NEGATIVE mg/dL   Hgb urine dipstick NEGATIVE NEGATIVE   Bilirubin Urine NEGATIVE NEGATIVE   Ketones, ur NEGATIVE NEGATIVE mg/dL   Protein, ur NEGATIVE NEGATIVE mg/dL   Nitrite NEGATIVE NEGATIVE   Leukocytes,Ua TRACE (A) NEGATIVE   RBC / HPF 0-5 0 - 5 RBC/hpf   WBC, UA 6-10 0 - 5 WBC/hpf   Bacteria, UA MANY (A) NONE SEEN   Squamous Epithelial / HPF 6-10 0 - 5 /HPF   Mucus PRESENT     Imaging:  [place] [name] OB FOLLOW UP  Result Date: 04/22/2023 ----------------------------------------------------------------------  OBSTETRICS REPORT                       (Signed Final 04/22/2023 08:15 am) ---------------------------------------------------------------------- Patient Info  ID #:       [id number]                          D.O.B.:  Sep 09, 1995 (27 yrs)  Name:       Shelly Watson                 Visit Date: 04/22/2023 07:30 am ---------------------------------------------------------------------- Performed By  Attending:        Braxton Feathers DO       Ref. Address:     Faculty  Performed By:     Shelly Watson BS,      [facility]                                     [facility]                                                             [facility]                                                             [facility]  Referred By:      Shelly Watson  CONSTANT MD ---------------------------------------------------------------------- Orders  #  Description                           Code        Ordered By  1  [place] [name] OB FOLLOW UP                   76816.01    Shelly Watson  2  [place] [name] FETAL BPP WO NON               76819.01    Shelly Watson     STRESS ----------------------------------------------------------------------  #  Order #                     Accession #                Episode #  1  [id number]                   [id number]                 [id number]  2   [id number]                   [id number]                 [id number] ---------------------------------------------------------------------- Indications  Gestational diabetes in pregnancy, diet        O24.410  controlled  Thrombocytopenia affecting pregnancy,          O99.119, D69.6  antepartum  [redacted] weeks gestation of pregnancy                Z3A.34  LR NIPS/Horizon negative  Echogenic intracardiac focus of the heart      O35.8XX0  (EIF)  Encounter for other antenatal screening        Z36.2  follow-up ---------------------------------------------------------------------- Vital Signs  BP:          107/68 ---------------------------------------------------------------------- Fetal Evaluation  Num Of Fetuses:         1  Fetal Heart Rate(bpm):  129  Cardiac Activity:       Observed  Presentation:           Cephalic  Placenta:               Anterior  P. Cord Insertion:      Previously seen  Amniotic Fluid  AFI FV:      Within normal limits  AFI Sum(cm)     %Tile       Largest Pocket(cm)  22.31           84          8.22  RUQ(cm)       RLQ(cm)       LUQ(cm)        LLQ(cm)  5.86          3.47          8.22           4.76 ---------------------------------------------------------------------- Biophysical Evaluation  Amniotic F.V:   Pocket => 2 cm             F. Tone:        Observed  F. Movement:    Observed                   Score:          8/8  F. Breathing:  Observed ---------------------------------------------------------------------- Biometry  BPD:      78.4  mm     G. Age:  31w 3d        < 1  %    CI:        76.92   %    70 - 86                                                          FL/HC:      23.6   %    19.4 - 21.8  HC:      283.1  mm     G. Age:  31w 0d        < 1  %    HC/AC:      0.92        0.96 - 1.11  AC:      308.6  mm     G. Age:  34w 6d         66  %    FL/BPD:     85.3   %    71 - 87  FL:       66.9  mm     G. Age:  34w 3d         41  %    FL/AC:      21.7   %    20 - 24  Est. FW:    2312  gm      5 lb 2 oz      31  % ---------------------------------------------------------------------- OB History  Gravidity:    1 ---------------------------------------------------------------------- Gestational Age  LMP:           34w 3d        Date:  08/24/22                   EDD:   05/31/23  U/S Today:     33w 0d                                        EDD:   06/10/23  Best:          34w 3d     Det. By:  LMP  (08/24/22)          EDD:   05/31/23 ---------------------------------------------------------------------- Anatomy  Cranium:               Appears normal         LVOT:                   Previously seen  Cavum:                 Appears normal         Aortic Arch:            Previously seen  Ventricles:            Appears normal         Ductal Arch:            Previously seen  Choroid Plexus:        Previously seen        Diaphragm:  Appears normal  Cerebellum:            Appears normal         Stomach:                Appears normal, left                                                                        sided  Posterior Fossa:       Previously seen        Abdomen:                Appears normal  Nuchal Fold:           Previously seen        Abdominal Wall:         Previously seen  Face:                  Orbits and profile     Cord Vessels:           Previously seen                         previously seen  Lips:                  Appears normal         Kidneys:                Appear normal  Palate:                Previously seen        Bladder:                Appears normal  Thoracic:              Appears normal         Spine:                  Previously seen  Heart:                 Appears normal; EIF    Upper Extremities:      Previously seen  RVOT:                  Previously seen        Lower Extremities:      Previously seen  Other:  IVC/SVC, LVOT, Hands,feet, Heels, Nasal bone, lenses, maxilla,          mandible and falx previously visualized. Female gender.  ---------------------------------------------------------------------- Cervix Uterus Adnexa  Cervix  Not visualized (advanced GA >24wks) ---------------------------------------------------------------------- Comments  The patient is here for a follow-up BPP and growth ultrasound  at 34w 3d for GDM EDD: 05/31/2023 dated by LMP  (08/24/22).  She reports that most of her fasting blood sugars  are above 95.  Her ranges 98-106.  Her postprandial blood  sugars are all normal.  I discussed adding a high-protein  snack at night for the next week and if she still has elevated  fasting blood sugar we could start NPH at night.  Sonographic findings  Single intrauterine pregnancy.  Fetal cardiac activity: Observed.  Presentation: Cephalic.  Interval fetal  anatomy appears normal.  Fetal biometry shows the estimated fetal weight at the 31  percentile.  Amniotic fluid volume: Within normal limits. AFI: 22.31 cm.  MVP: 8.22 cm.  Placenta: Anterior.  BPP: 8/8.  Recommendations  1. Weekly antenatal testing until delivery due to elevated  fasting blood sugars. Add a high-protein snack at night for the  next week and if she still has elevated fasting blood sugar we  could start NPH at night.  2. Growth ultrasound in 4 weeks  3. Delivery around [redacted] weeks gestation ----------------------------------------------------------------------                  Shelly Feathers, DO Electronically Signed Final Report   04/22/2023 08:15 am ----------------------------------------------------------------------   [place] [name] FETAL BPP WO NON STRESS  Result Date: 04/22/2023 ----------------------------------------------------------------------  OBSTETRICS REPORT                       (Signed Final 04/22/2023 08:15 am) ---------------------------------------------------------------------- Patient Info  ID #:       [id number]                          D.O.B.:  1995-09-03 (27 yrs)  Name:       Shelly Watson                 Visit Date: 04/22/2023 07:30 am  ---------------------------------------------------------------------- Performed By  Attending:        Braxton Feathers DO       Ref. Address:     Faculty  Performed By:     Shelly Watson BS,      [facility]                                     [facility]                                                             [facility]                                                             [facility]  Referred By:      Catalina Antigua MD ---------------------------------------------------------------------- Orders  #  Description                           Code        Ordered By  1  [place] [name] OB FOLLOW UP                   65784.69    Shelly Watson  2  [place] [name] FETAL BPP WO NON  16109.60    [facility]     STRESS ----------------------------------------------------------------------  #  Order #                     Accession #                Episode #  1  [id number]                   [id number]                 [id number]  2  [id number]                   [id number]                 [id number] ---------------------------------------------------------------------- Indications  Gestational diabetes in pregnancy, diet        O24.410  controlled  Thrombocytopenia affecting pregnancy,          O99.119, D69.6  antepartum  [redacted] weeks gestation of pregnancy                Z3A.34  LR NIPS/Horizon negative  Echogenic intracardiac focus of the heart      O35.8XX0  (EIF)  Encounter for other antenatal screening        Z36.2  follow-up ---------------------------------------------------------------------- Vital Signs  BP:          107/68 ---------------------------------------------------------------------- Fetal Evaluation  Num Of Fetuses:         1  Fetal Heart Rate(bpm):  129  Cardiac Activity:       Observed  Presentation:           Cephalic  Placenta:               Anterior  P. Cord Insertion:      Previously seen  Amniotic Fluid  AFI FV:      Within normal limits   AFI Sum(cm)     %Tile       Largest Pocket(cm)  22.31           84          8.22  RUQ(cm)       RLQ(cm)       LUQ(cm)        LLQ(cm)  5.86          3.47          8.22           4.76 ---------------------------------------------------------------------- Biophysical Evaluation  Amniotic F.V:   Pocket => 2 cm             F. Tone:        Observed  F. Movement:    Observed                   Score:          8/8  F. Breathing:   Observed ---------------------------------------------------------------------- Biometry  BPD:      78.4  mm     G. Age:  31w 3d        < 1  %    CI:        76.92   %    70 - 86  FL/HC:      23.6   %    19.4 - 21.8  HC:      283.1  mm     G. Age:  31w 0d        < 1  %    HC/AC:      0.92        0.96 - 1.11  AC:      308.6  mm     G. Age:  34w 6d         66  %    FL/BPD:     85.3   %    71 - 87  FL:       66.9  mm     G. Age:  34w 3d         41  %    FL/AC:      21.7   %    20 - 24  Est. FW:    2312  gm      5 lb 2 oz     31  % ---------------------------------------------------------------------- OB History  Gravidity:    1 ---------------------------------------------------------------------- Gestational Age  LMP:           34w 3d        Date:  08/24/22                   EDD:   05/31/23  U/S Today:     33w 0d                                        EDD:   06/10/23  Best:          34w 3d     Det. By:  LMP  (08/24/22)          EDD:   05/31/23 ---------------------------------------------------------------------- Anatomy  Cranium:               Appears normal         LVOT:                   Previously seen  Cavum:                 Appears normal         Aortic Arch:            Previously seen  Ventricles:            Appears normal         Ductal Arch:            Previously seen  Choroid Plexus:        Previously seen        Diaphragm:              Appears normal  Cerebellum:            Appears normal         Stomach:                Appears normal,  left  sided  Posterior Fossa:       Previously seen        Abdomen:                Appears normal  Nuchal Fold:           Previously seen        Abdominal Wall:         Previously seen  Face:                  Orbits and profile     Cord Vessels:           Previously seen                         previously seen  Lips:                  Appears normal         Kidneys:                Appear normal  Palate:                Previously seen        Bladder:                Appears normal  Thoracic:              Appears normal         Spine:                  Previously seen  Heart:                 Appears normal; EIF    Upper Extremities:      Previously seen  RVOT:                  Previously seen        Lower Extremities:      Previously seen  Other:  IVC/SVC, LVOT, Hands,feet, Heels, Nasal bone, lenses, maxilla,          mandible and falx previously visualized. Female gender. ---------------------------------------------------------------------- Cervix Uterus Adnexa  Cervix  Not visualized (advanced GA >24wks) ---------------------------------------------------------------------- Comments  The patient is here for a follow-up BPP and growth ultrasound  at 34w 3d for GDM EDD: 05/31/2023 dated by LMP  (08/24/22).  She reports that most of her fasting blood sugars  are above 95.  Her ranges 98-106.  Her postprandial blood  sugars are all normal.  I discussed adding a high-protein  snack at night for the next week and if she still has elevated  fasting blood sugar we could start NPH at night.  Sonographic findings  Single intrauterine pregnancy.  Fetal cardiac activity: Observed.  Presentation: Cephalic.  Interval fetal anatomy appears normal.  Fetal biometry shows the estimated fetal weight at the 31  percentile.  Amniotic fluid volume: Within normal limits. AFI: 22.31 cm.  MVP: 8.22 cm.  Placenta: Anterior.  BPP: 8/8.  Recommendations  1. Weekly antenatal  testing until delivery due to elevated  fasting blood sugars. Add a high-protein snack at night for the  next week and if she still has elevated fasting blood sugar we  could start NPH at night.  2. Growth ultrasound in 4 weeks  3. Delivery around [redacted] weeks gestation ----------------------------------------------------------------------                  Shelly Feathers, DO Electronically  Signed Final Report   04/22/2023 08:15 am ----------------------------------------------------------------------    EKG:  No S1, Q3 but does have twave inversion in 3. Also other t-wave inversions Mild sinus tach  MAU Course: Offered tylenol and flexeril, patient declined PO hydration Normal oxygen levels Labs ordered  Assessment: 1. [redacted] weeks gestation of pregnancy   2. Preterm contractions   3. Chest pain on breathing     Plan: Care turned over to [employer].       Reva Bores, MD 04/27/2023 9:51 PM

## 2023-04-27 NOTE — MAU Note (Signed)
..  Shelly Watson is a 27 y.o. at [redacted]w[redacted]d here in MAU reporting: stabbing chest pain that began today reports it hurts more when she inhales. Denies recent cough or recent sickness.  Reports lower back pain that began around noon, the pain is intermittent.  +FM but not as much regular.  Denies vaginal bleeding or leaking of fluid.   Pain score: chest 4/10; back 6/10 Vitals:   04/27/23 2012  BP: 117/74  Pulse: (!) 102  Resp: (!) 22  Temp: 98.2 F (36.8 C)  SpO2: 100%     FHT:150 Lab orders placed from triage:  UA EKG

## 2023-04-27 NOTE — MAU Provider Note (Incomplete Revision)
Obstetric Attending MAU Note  Chief Complaint:  chest pain   HPI: Shelly Watson is a 27 y.o. G1P0000 at [redacted]w[redacted]d who presents to maternity admissions reporting left sided chest pain, some SOB, low back pain, buttock pain. Denies fever, chills, recent uri sx's. . Denies contractions, leakage of fluid or vaginal bleeding. Good fetal movement.   Pregnancy Course: Receives care at [facility]  Patient Active Problem List   Diagnosis Date Noted   GDM (gestational diabetes mellitus) 03/01/2023   Anemia 03/01/2023   Benign gestational thrombocytopenia in second trimester (HCC) 11/23/2022   Supervision of other normal pregnancy, antepartum 11/10/2022    Past Medical History:  Diagnosis Date   GDM (gestational diabetes mellitus) 02/2023   No pertinent past medical history    Strep throat     OB History  Gravida Para Term Preterm AB Living  1 0 0 0 0 0  SAB IAB Ectopic Multiple Live Births  0 0 0 0 0    # Outcome Date GA Lbr Len/2nd Weight Sex Type Anes PTL Lv  1 Current             Past Surgical History:  Procedure Laterality Date   NO PAST SURGERIES      Family History: History reviewed. No pertinent family history.  Social History: Social History   Tobacco Use   Smoking status: Never    Passive exposure: Never   Smokeless tobacco: Never  Vaping Use   Vaping status: Never Used  Substance Use Topics   Alcohol use: No   Drug use: No    Allergies:  Allergies  Allergen Reactions   Aspirin Itching and Rash    Medications Prior to Admission  Medication Sig Dispense Refill Last Dose   Blood Pressure Monitoring (BLOOD PRESSURE KIT) DEVI 1 Device by Does not apply route once a week. 1 each 0 Past Week   Prenatal Vit-Fe Fumarate-FA (PRENATAL VITAMINS PO) Take 1 tablet by mouth daily.   04/26/2023   ferrous sulfate 325 (65 FE) MG tablet Take 1 tablet (325 mg total) by mouth every other day. (Patient not taking: Reported on 03/25/2023) 90 tablet 1 More than a month    ROS:  Pertinent findings in history of present illness.  Physical Exam  Blood pressure 118/79, pulse 98, temperature 98.1 F (36.7 C), temperature source Oral, resp. rate (!) 22, height 5\' 5"  (1.651 m), weight 82.2 kg, last menstrual period 08/24/2022, SpO2 99%. CONSTITUTIONAL: Well-developed, well-nourished female in no acute distress.  HENT:  Normocephalic, atraumatic, External right and left ear normal.  EYES: Conjunctivae and EOM are normal.  No scleral icterus.  NECK: Normal range of motion, supple, no masses SKIN: Skin is warm and dry. No rash noted. Not diaphoretic. No erythema. No pallor. NEUROLGIC: Alert and oriented to person, place, and time. CARDIOVASCULAR: Normal heart rate noted, regular rhythm RESPIRATORY: Effort and breath sounds normal, no problems with respiration noted ABDOMEN: Soft, nontender, nondistended, gravid appropriate for gestational age MUSCULOSKELETAL: Normal range of motion. No edema and no tenderness. 2+ distal pulses.  SPECULUM EXAM: NEFG, physiologic discharge, no blood, cervix clean Dilation: Fingertip Effacement (%): 90 Station: -1 Exam by:: Dr. Shawnie Pons  FHT:  Baseline 140 , moderate variability, accelerations present, no decelerations Contractions: q 2-3 mins   Labs: Results for orders placed or performed during the hospital encounter of 04/27/23 (from the past 24 hour(s))  CBC     Status: Abnormal   Collection Time: 04/27/23  9:04 PM  Result Value Ref Range  WBC 12.0 (H) 4.0 - 10.5 K/uL   RBC 3.17 (L) 3.87 - 5.11 MIL/uL   Hemoglobin 10.0 (L) 12.0 - 15.0 g/dL   HCT 32.3 (L) 55.7 - 32.2 %   MCV 91.8 80.0 - 100.0 fL   MCH 31.5 26.0 - 34.0 pg   MCHC 34.4 30.0 - 36.0 g/dL   RDW 02.5 42.7 - 06.2 %   Platelets 134 (L) 150 - 400 K/uL   nRBC 0.0 0.0 - 0.2 %  Comprehensive metabolic panel     Status: Abnormal   Collection Time: 04/27/23  9:04 PM  Result Value Ref Range   Sodium 133 (L) 135 - 145 mmol/L   Potassium 3.3 (L) 3.5 - 5.1 mmol/L   Chloride  100 98 - 111 mmol/L   CO2 21 (L) 22 - 32 mmol/L   Glucose, Bld 87 70 - 99 mg/dL   BUN 5 (L) 6 - 20 mg/dL   Creatinine, Ser 3.76 0.44 - 1.00 mg/dL   Calcium 28.3 8.9 - 15.1 mg/dL   Total Protein 6.9 6.5 - 8.1 g/dL   Albumin 3.0 (L) 3.5 - 5.0 g/dL   AST 15 15 - 41 U/L   ALT 10 0 - 44 U/L   Alkaline Phosphatase 86 38 - 126 U/L   Total Bilirubin 0.2 (L) 0.3 - 1.2 mg/dL   GFR, Estimated >76 >16 mL/min   Anion gap 12 5 - 15  Brain natriuretic peptide     Status: None   Collection Time: 04/27/23  9:04 PM  Result Value Ref Range   B Natriuretic Peptide 72.1 0.0 - 100.0 pg/mL  Troponin I (High Sensitivity)     Status: None   Collection Time: 04/27/23  9:04 PM  Result Value Ref Range   Troponin I (High Sensitivity) 7 <18 ng/L  Urinalysis, Routine w reflex microscopic -Urine, Clean Catch     Status: Abnormal   Collection Time: 04/27/23  9:26 PM  Result Value Ref Range   Color, Urine YELLOW YELLOW   APPearance HAZY (A) CLEAR   Specific Gravity, Urine 1.008 1.005 - 1.030   pH 7.0 5.0 - 8.0   Glucose, UA NEGATIVE NEGATIVE mg/dL   Hgb urine dipstick NEGATIVE NEGATIVE   Bilirubin Urine NEGATIVE NEGATIVE   Ketones, ur NEGATIVE NEGATIVE mg/dL   Protein, ur NEGATIVE NEGATIVE mg/dL   Nitrite NEGATIVE NEGATIVE   Leukocytes,Ua TRACE (A) NEGATIVE   RBC / HPF 0-5 0 - 5 RBC/hpf   WBC, UA 6-10 0 - 5 WBC/hpf   Bacteria, UA MANY (A) NONE SEEN   Squamous Epithelial / HPF 6-10 0 - 5 /HPF   Mucus PRESENT     Imaging:  [place] [name] OB FOLLOW UP  Result Date: 04/22/2023 ----------------------------------------------------------------------  OBSTETRICS REPORT                       (Signed Final 04/22/2023 08:15 am) ---------------------------------------------------------------------- Patient Info  ID #:       [id number]                          D.O.B.:  Jun 28, 1996 (27 yrs)  Name:       Shelly Watson                 Visit Date: 04/22/2023 07:30 am  ---------------------------------------------------------------------- Performed By  Attending:        Braxton Feathers DO       Ref. Address:  Faculty  Performed By:     Emeline Darling BS,      Location[facility]                    RD[facility]                                     [facility]  Referred By:      Catalina Antigua MD ---------------------------------------------------------------------- Orders  #  Description                           Code        Ordered By  1  [place] [name] OB FOLLOW UP                   76816.01    SAVANNAH BROWN  2  [place] [name] FETAL BPP WO NON               76819.01    [facility]     STRESS ----------------------------------------------------------------------  #  Order #                     Accession #                Episode #  1  [id number]                   [id number]                 [id number]  2  [id number]                   [id number]                 [id number] ---------------------------------------------------------------------- Indications  Gestational diabetes in pregnancy, diet        O24.410  controlled  Thrombocytopenia affecting pregnancy,          O99.119, D69.6  antepartum  [redacted] weeks gestation of pregnancy                Z3A.34  LR NIPS/Horizon negative  Echogenic intracardiac focus of the heart      O35.8XX0  (EIF)  Encounter for other antenatal screening        Z36.2  follow-up ---------------------------------------------------------------------- Vital Signs  BP:          107/68 ---------------------------------------------------------------------- Fetal Evaluation  Num Of Fetuses:         1  Fetal Heart  Rate(bpm):  129  Cardiac Activity:       Observed  Presentation:           Cephalic  Placenta:               Anterior  P. Cord Insertion:      Previously seen  Amniotic Fluid  AFI FV:      Within normal limits   AFI Sum(cm)     %Tile       Largest Pocket(cm)  22.31           84          8.22  RUQ(cm)       RLQ(cm)       LUQ(cm)        LLQ(cm)  5.86          3.47          8.22           4.76 ---------------------------------------------------------------------- Biophysical Evaluation  Amniotic F.V:   Pocket => 2 cm             F. Tone:        Observed  F. Movement:    Observed                   Score:          8/8  F. Breathing:   Observed ---------------------------------------------------------------------- Biometry  BPD:      78.4  mm     G. Age:  31w 3d        < 1  %    CI:        76.92   %    70 - 86                                                          FL/HC:      23.6   %    19.4 - 21.8  HC:      283.1  mm     G. Age:  31w 0d        < 1  %    HC/AC:      0.92        0.96 - 1.11  AC:      308.6  mm     G. Age:  34w 6d         66  %    FL/BPD:     85.3   %    71 - 87  FL:       66.9  mm     G. Age:  34w 3d         41  %    FL/AC:      21.7   %    20 - 24  Est. FW:    2312  gm      5 lb 2 oz     31  % ---------------------------------------------------------------------- OB History  Gravidity:    1 ---------------------------------------------------------------------- Gestational Age  LMP:           34w 3d        Date:  08/24/22                   EDD:   05/31/23  U/S  Today:     33w 0d                                        EDD:   06/10/23  Best:          34w 3d     Det. By:  LMP  (08/24/22)          EDD:   05/31/23 ---------------------------------------------------------------------- Anatomy  Cranium:               Appears normal         LVOT:                   Previously seen  Cavum:                 Appears normal         Aortic Arch:            Previously seen  Ventricles:            Appears normal         Ductal Arch:            Previously seen  Choroid Plexus:        Previously seen        Diaphragm:              Appears normal  Cerebellum:            Appears normal         Stomach:                Appears normal,  left                                                                        sided  Posterior Fossa:       Previously seen        Abdomen:                Appears normal  Nuchal Fold:           Previously seen        Abdominal Wall:         Previously seen  Face:                  Orbits and profile     Cord Vessels:           Previously seen                         previously seen  Lips:                  Appears normal         Kidneys:                Appear normal  Palate:                Previously seen        Bladder:                Appears normal  Thoracic:  Appears normal         Spine:                  Previously seen  Heart:                 Appears normal; EIF    Upper Extremities:      Previously seen  RVOT:                  Previously seen        Lower Extremities:      Previously seen  Other:  IVC/SVC, LVOT, Hands,feet, Heels, Nasal bone, lenses, maxilla,          mandible and falx previously visualized. Female gender. ---------------------------------------------------------------------- Cervix Uterus Adnexa  Cervix  Not visualized (advanced GA >24wks) ---------------------------------------------------------------------- Comments  The patient is here for a follow-up BPP and growth ultrasound  at 34w 3d for GDM EDD: 05/31/2023 dated by LMP  (08/24/22).  She reports that most of her fasting blood sugars  are above 95.  Her ranges 98-106.  Her postprandial blood  sugars are all normal.  I discussed adding a high-protein  snack at night for the next week and if she still has elevated  fasting blood sugar we could start NPH at night.  Sonographic findings  Single intrauterine pregnancy.  Fetal cardiac activity: Observed.  Presentation: Cephalic.  Interval fetal anatomy appears normal.  Fetal biometry shows the estimated fetal weight at the 31  percentile.  Amniotic fluid volume: Within normal limits. AFI: 22.31 cm.  MVP: 8.22 cm.  Placenta: Anterior.  BPP: 8/8.  Recommendations  1. Weekly antenatal  testing until delivery due to elevated  fasting blood sugars. Add a high-protein snack at night for the  next week and if she still has elevated fasting blood sugar we  could start NPH at night.  2. Growth ultrasound in 4 weeks  3. Delivery around [redacted] weeks gestation ----------------------------------------------------------------------                  Braxton Feathers, DO Electronically Signed Final Report   04/22/2023 08:15 am ----------------------------------------------------------------------   [place] [name] FETAL BPP WO NON STRESS  Result Date: 04/22/2023 ----------------------------------------------------------------------  OBSTETRICS REPORT                       (Signed Final 04/22/2023 08:15 am) ---------------------------------------------------------------------- Patient Info  ID #:       [id number]                          D.O.B.:  Nov 22, 1995 (27 yrs)  Name:       Shelly Watson                 Visit Date: 04/22/2023 07:30 am ---------------------------------------------------------------------- Performed By  Attending:        Braxton Feathers DO       Ref. Address:     Faculty  Performed By:     Emeline Darling BS,      Location:         [facility]                    RD[facility]                                     [facility]  Referred By:      Catalina Antigua MD ---------------------------------------------------------------------- Orders  #  Description                           Code        Ordered By  1  [place] [name] OB FOLLOW UP                   [phone]    SAVANNAH BROWN  2  [place] [name] FETAL BPP WO NON               76819.01    [facility]     STRESS ----------------------------------------------------------------------  #  Order #                     Accession #                Episode #  1  [id number]                   [id number]                 [id number]  2   [id number]                   [id number]                 [id number] ---------------------------------------------------------------------- Indications  Gestational diabetes in pregnancy, diet        O24.410  controlled  Thrombocytopenia affecting pregnancy,          O99.119, D69.6  antepartum  [redacted] weeks gestation of pregnancy                Z3A.34  LR NIPS/Horizon negative  Echogenic intracardiac focus of the heart      O35.8XX0  (EIF)  Encounter for other antenatal screening        Z36.2  follow-up ---------------------------------------------------------------------- Vital Signs  BP:          107/68 ---------------------------------------------------------------------- Fetal Evaluation  Num Of Fetuses:         1  Fetal Heart Rate(bpm):  129  Cardiac Activity:       Observed  Presentation:           Cephalic  Placenta:               Anterior  P. Cord Insertion:      Previously seen  Amniotic Fluid  AFI FV:      Within normal limits  AFI Sum(cm)     %Tile       Largest Pocket(cm)  22.31           84          8.22  RUQ(cm)       RLQ(cm)       LUQ(cm)        LLQ(cm)  5.86          3.47  8.22           4.76 ---------------------------------------------------------------------- Biophysical Evaluation  Amniotic F.V:   Pocket => 2 cm             F. Tone:        Observed  F. Movement:    Observed                   Score:          8/8  F. Breathing:   Observed ---------------------------------------------------------------------- Biometry  BPD:      78.4  mm     G. Age:  31w 3d        < 1  %    CI:        76.92   %    70 - 86                                                          FL/HC:      23.6   %    19.4 - 21.8  HC:      283.1  mm     G. Age:  31w 0d        < 1  %    HC/AC:      0.92        0.96 - 1.11  AC:      308.6  mm     G. Age:  34w 6d         66  %    FL/BPD:     85.3   %    71 - 87  FL:       66.9  mm     G. Age:  34w 3d         41  %    FL/AC:      21.7   %    20 - 24  Est. FW:    2312  gm      5 lb 2 oz      31  % ---------------------------------------------------------------------- OB History  Gravidity:    1 ---------------------------------------------------------------------- Gestational Age  LMP:           34w 3d        Date:  08/24/22                   EDD:   05/31/23  U/S Today:     33w 0d                                        EDD:   06/10/23  Best:          34w 3d     Det. By:  LMP  (08/24/22)          EDD:   05/31/23 ---------------------------------------------------------------------- Anatomy  Cranium:               Appears normal         LVOT:                   Previously seen  Cavum:  Appears normal         Aortic Arch:            Previously seen  Ventricles:            Appears normal         Ductal Arch:            Previously seen  Choroid Plexus:        Previously seen        Diaphragm:              Appears normal  Cerebellum:            Appears normal         Stomach:                Appears normal, left                                                                        sided  Posterior Fossa:       Previously seen        Abdomen:                Appears normal  Nuchal Fold:           Previously seen        Abdominal Wall:         Previously seen  Face:                  Orbits and profile     Cord Vessels:           Previously seen                         previously seen  Lips:                  Appears normal         Kidneys:                Appear normal  Palate:                Previously seen        Bladder:                Appears normal  Thoracic:              Appears normal         Spine:                  Previously seen  Heart:                 Appears normal; EIF    Upper Extremities:      Previously seen  RVOT:                  Previously seen        Lower Extremities:      Previously seen  Other:  IVC/SVC, LVOT, Hands,feet, Heels, Nasal bone, lenses, maxilla,          mandible and falx previously visualized. Female gender.  ---------------------------------------------------------------------- Cervix Uterus Adnexa  Cervix  Not visualized (advanced GA >24wks) ---------------------------------------------------------------------- Comments  The patient is  here for a follow-up BPP and growth ultrasound  at 34w 3d for GDM EDD: 05/31/2023 dated by LMP  (08/24/22).  She reports that most of her fasting blood sugars  are above 95.  Her ranges 98-106.  Her postprandial blood  sugars are all normal.  I discussed adding a high-protein  snack at night for the next week and if she still has elevated  fasting blood sugar we could start NPH at night.  Sonographic findings  Single intrauterine pregnancy.  Fetal cardiac activity: Observed.  Presentation: Cephalic.  Interval fetal anatomy appears normal.  Fetal biometry shows the estimated fetal weight at the 31  percentile.  Amniotic fluid volume: Within normal limits. AFI: 22.31 cm.  MVP: 8.22 cm.  Placenta: Anterior.  BPP: 8/8.  Recommendations  1. Weekly antenatal testing until delivery due to elevated  fasting blood sugars. Add a high-protein snack at night for the  next week and if she still has elevated fasting blood sugar we  could start NPH at night.  2. Growth ultrasound in 4 weeks  3. Delivery around [redacted] weeks gestation ----------------------------------------------------------------------                  Braxton Feathers, DO Electronically Signed Final Report   04/22/2023 08:15 am ----------------------------------------------------------------------    EKG:  No S1, Q3 but does have twave inversion in 3. Also other t-wave inversions Mild sinus tach  MAU Course: Offered tylenol and flexeril, patient declined PO hydration Normal oxygen levels Labs ordered  Assessment: 1. [redacted] weeks gestation of pregnancy   2. Preterm contractions   3. Chest pain on breathing     Plan: Care turned over to [employer].       Carlynn Herald, CNM 04/27/2023 10:45 PM

## 2023-04-29 ENCOUNTER — Ambulatory Visit: Payer: Medicaid Other | Attending: Maternal & Fetal Medicine | Admitting: *Deleted

## 2023-04-29 DIAGNOSIS — Z3A35 35 weeks gestation of pregnancy: Secondary | ICD-10-CM | POA: Diagnosis not present

## 2023-04-29 DIAGNOSIS — E119 Type 2 diabetes mellitus without complications: Secondary | ICD-10-CM | POA: Diagnosis present

## 2023-04-29 DIAGNOSIS — O24419 Gestational diabetes mellitus in pregnancy, unspecified control: Secondary | ICD-10-CM | POA: Diagnosis not present

## 2023-04-29 DIAGNOSIS — E139 Other specified diabetes mellitus without complications: Secondary | ICD-10-CM | POA: Diagnosis not present

## 2023-04-29 DIAGNOSIS — O24913 Unspecified diabetes mellitus in pregnancy, third trimester: Secondary | ICD-10-CM | POA: Diagnosis not present

## 2023-04-29 NOTE — Procedures (Signed)
Shelly Watson 06/22/1996 [redacted]w[redacted]d  Fetus A Non-Stress Test Interpretation for 04/29/23  NST only   Indication: Diabetes  Fetal Heart Rate A Mode: External Baseline Rate (A): 145 bpm Variability: Moderate Accelerations: 15 x 15 Decelerations: None Multiple birth?: No  Uterine Activity Mode: Palpation, Toco Contraction Frequency (min): none Resting Tone Palpated: Relaxed  Interpretation (Fetal Testing) Nonstress Test Interpretation: Reactive Overall Impression: Reassuring for gestational age Comments: Dr. Darra Lis reviewed tracing

## 2023-05-01 NOTE — Progress Notes (Signed)
  HIGH-RISK PREGNANCY OFFICE VISIT Patient name: Shelly Watson MRN [id number]  Date of birth: 03/02/1996 Chief Complaint:   No chief complaint on file.  History of Present Illness:   Shelly Watson is a 27 y.o. G59P0000 female at [redacted]w[redacted]d with an Estimated Date of Delivery: 05/31/23 being seen today for ongoing management of a high-risk pregnancy complicated by A1DM Today she reports no complaints. Contractions: Not present.  .  Movement: Present. denies leaking of fluid.  Review of Systems:   Pertinent items are noted in HPI Denies abnormal vaginal discharge w/ itching/odor/irritation, headaches, visual changes, shortness of breath, chest pain, abdominal pain, severe nausea/vomiting, or problems with urination or bowel movements unless otherwise stated above. Pertinent History Reviewed:  Reviewed past medical,surgical, social, obstetrical and family history.  Reviewed problem list, medications and allergies. Physical Assessment:   Vitals:   04/23/23 1456  BP: 108/72  Pulse: (!) 120  Weight: 181 lb 4.8 oz (82.2 kg)  Body mass index is 30.17 kg/m.           Physical Examination:   General appearance: alert, well appearing, and in no distress and oriented to person, place, and time  Mental status: alert, oriented to person, place, and time, normal mood, behavior, speech, dress, motor activity, and thought processes  Skin: warm & dry   Extremities: Edema: Trace    Cardiovascular: normal heart rate noted  Respiratory: normal respiratory effort, no distress  Abdomen: gravid, soft, non-tender  Pelvic: Cervical exam deferred         Fetal Status: Fetal Heart Rate (bpm): 142   Movement: Present    Fetal Surveillance Testing today: none   No results found for this or any previous visit (from the past 24 hour(s)).  Assessment & Plan:  1) High-risk pregnancy G1P0000 at [redacted]w[redacted]d with an Estimated Date of Delivery: 05/31/23   2) Supervision of high risk pregnancy in third trimester -  Anticipatory guidance for 2 hr GTT - advised to fast after midnight without anything to eat or drink (except for water), will have fasting blood drawn, drink the glucola drink (flavor choices: orange or fruit punch), have a visit with a provider during the first hour of testing, wait in the lab waiting room to have blood drawn at 1 hour and then 2 hours after finishing glucola drink.  3) Diet controlled gestational diabetes mellitus (GDM) in third trimester - Review of Blood Sugar Levels: FBS range = 88-94 mg/dL  2 hr PP range = 323-557  4) [redacted] weeks gestation of pregnancy   Meds: No orders of the defined types were placed in this encounter.  Labs/procedures today: none  Treatment Plan:  continue GDM regimen  Reviewed: Preterm labor symptoms and general obstetric precautions including but not limited to vaginal bleeding, contractions, leaking of fluid and fetal movement were reviewed in detail with the patient.  All questions were answered. Has home bp cuff. Check bp weekly, let us know if >140/90.   Follow-up: Return in about 2 weeks (around 05/07/2023) for Return OB w/GBS.  No orders of the defined types were placed in this encounter.  Raelyn Mora MSN, CNM 04/23/2023 2:15 PM

## 2023-05-06 ENCOUNTER — Other Ambulatory Visit: Payer: Self-pay

## 2023-05-06 ENCOUNTER — Ambulatory Visit: Payer: Medicaid Other | Attending: Maternal & Fetal Medicine

## 2023-05-06 DIAGNOSIS — O35BXX0 Maternal care for other (suspected) fetal abnormality and damage, fetal cardiac anomalies, not applicable or unspecified: Secondary | ICD-10-CM | POA: Diagnosis not present

## 2023-05-06 DIAGNOSIS — O24419 Gestational diabetes mellitus in pregnancy, unspecified control: Secondary | ICD-10-CM | POA: Diagnosis present

## 2023-05-06 DIAGNOSIS — D696 Thrombocytopenia, unspecified: Secondary | ICD-10-CM | POA: Diagnosis not present

## 2023-05-06 DIAGNOSIS — O24410 Gestational diabetes mellitus in pregnancy, diet controlled: Secondary | ICD-10-CM

## 2023-05-06 DIAGNOSIS — Z3A36 36 weeks gestation of pregnancy: Secondary | ICD-10-CM

## 2023-05-06 DIAGNOSIS — O99113 Other diseases of the blood and blood-forming organs and certain disorders involving the immune mechanism complicating pregnancy, third trimester: Secondary | ICD-10-CM

## 2023-05-13 ENCOUNTER — Ambulatory Visit (INDEPENDENT_AMBULATORY_CARE_PROVIDER_SITE_OTHER): Payer: Medicaid Other | Admitting: Obstetrics and Gynecology

## 2023-05-13 ENCOUNTER — Other Ambulatory Visit (HOSPITAL_COMMUNITY)
Admission: RE | Admit: 2023-05-13 | Discharge: 2023-05-13 | Disposition: A | Discharge status: Home / Self Care | Payer: Medicaid Other | Source: Ambulatory Visit | Attending: Obstetrics and Gynecology | Admitting: Obstetrics and Gynecology

## 2023-05-13 ENCOUNTER — Ambulatory Visit: Payer: Medicaid Other | Attending: Maternal & Fetal Medicine | Admitting: *Deleted

## 2023-05-13 ENCOUNTER — Other Ambulatory Visit: Payer: Self-pay

## 2023-05-13 VITALS — BP 109/75 | HR 97 | Wt 183.6 lb

## 2023-05-13 DIAGNOSIS — O0993 Supervision of high risk pregnancy, unspecified, third trimester: Secondary | ICD-10-CM

## 2023-05-13 DIAGNOSIS — O24410 Gestational diabetes mellitus in pregnancy, diet controlled: Secondary | ICD-10-CM | POA: Insufficient documentation

## 2023-05-13 DIAGNOSIS — O24415 Gestational diabetes mellitus in pregnancy, controlled by oral hypoglycemic drugs: Secondary | ICD-10-CM

## 2023-05-13 DIAGNOSIS — Z3493 Encounter for supervision of normal pregnancy, unspecified, third trimester: Secondary | ICD-10-CM | POA: Diagnosis present

## 2023-05-13 DIAGNOSIS — Z3A37 37 weeks gestation of pregnancy: Secondary | ICD-10-CM | POA: Diagnosis not present

## 2023-05-13 MED ORDER — METFORMIN HCL 500 MG PO TABS
500.0000 mg | ORAL_TABLET | Freq: Every day | ORAL | 5 refills | Status: DC
Start: 2023-05-13 — End: 2023-10-18

## 2023-05-13 NOTE — Procedures (Signed)
Shelly Watson Oct 15, 1995 [redacted]w[redacted]d  Fetus A Non-Stress Test Interpretation for 05/13/23   NST only  Indication: Diabetes  Fetal Heart Rate A Mode: External Baseline Rate (A): 140 bpm Variability: Moderate Accelerations: 15 x 15 Decelerations: None Multiple birth?: No  Uterine Activity Mode: Palpation, Toco Contraction Frequency (min): 1 uc Contraction Duration (sec): 90 Contraction Quality: Mild Resting Tone Palpated: Relaxed Resting Time: Adequate  Interpretation (Fetal Testing) Nonstress Test Interpretation: Reactive Overall Impression: Reassuring for gestational age Comments: Dr. Parke Poisson reviewed tracing

## 2023-05-13 NOTE — Progress Notes (Signed)
   PRENATAL VISIT NOTE  Subjective:  Shelly Watson is a 27 y.o. G1P0000 at 110w3d being seen today for ongoing prenatal care.  She is currently monitored for the following issues for this high-risk pregnancy and has Supervision of other normal pregnancy, antepartum; Benign gestational thrombocytopenia in second trimester [facility]); GDM (gestational diabetes mellitus); and Anemia on their problem list.  Patient reports no complaints.  Contractions: Irritability. Vag. Bleeding: None.  Movement: Present. Denies leaking of fluid.   The following portions of the patient's history were reviewed and updated as appropriate: allergies, current medications, past family history, past medical history, past social history, past surgical history and problem list.   Objective:   Vitals:   05/13/23 1429  BP: 109/75  Pulse: 97  Weight: 183 lb 9.6 oz (83.3 kg)    Fetal Status:     Movement: Present     General:  Alert, oriented and cooperative. Patient is in no acute distress.  Skin: Skin is warm and dry. No rash noted.   Cardiovascular: Normal heart rate noted  Respiratory: Normal respiratory effort, no problems with respiration noted  Abdomen: Soft, gravid, appropriate for gestational age.  Pain/Pressure: Present     Pelvic: Cervical exam deferred        Extremities: Normal range of motion.  Edema: None  Mental Status: Normal mood and affect. Normal behavior. Normal judgment and thought content.   Assessment and Plan:  Pregnancy: G1P0000 at [redacted]w[redacted]d 1. Supervision of high risk pregnancy in third trimester BP and FHR normal  2. Diet controlled gestational diabetes mellitus (GDM) in third trimester 10/16 bpp 8/8, continue weekly testing,  follow up growth 10/30 Reports fasting still elevated 96-112, pp reports <120. Discussed starting metformin at dinner, continue dietary changes, discussed GI upset   3. [redacted] weeks gestation of pregnancy Swabs collected today   Term labor symptoms and general  obstetric precautions including but not limited to vaginal bleeding, contractions, leaking of fluid and fetal movement were reviewed in detail with the patient. Please refer to After Visit Summary for other counseling recommendations.   Return in one week for routine prenatal   Future Appointments  Date Time Provider Department Center  05/20/2023  2:30 PM Sue Lush, FNP [facility] None  05/20/2023  3:30 PM [facility] US3 [facility]    Albertine Grates, FNP

## 2023-05-14 LAB — CERVICOVAGINAL ANCILLARY ONLY
Chlamydia: NEGATIVE
Comment: NEGATIVE
Comment: NEGATIVE
Comment: NORMAL
Neisseria Gonorrhea: NEGATIVE
Trichomonas: NEGATIVE

## 2023-05-16 LAB — CULTURE, BETA STREP (GROUP B ONLY): Strep Gp B Culture: POSITIVE — AB

## 2023-05-20 ENCOUNTER — Other Ambulatory Visit: Payer: Self-pay | Admitting: Advanced Practice Midwife

## 2023-05-20 ENCOUNTER — Encounter: Payer: Self-pay | Admitting: Obstetrics and Gynecology

## 2023-05-20 ENCOUNTER — Other Ambulatory Visit: Payer: Self-pay

## 2023-05-20 ENCOUNTER — Ambulatory Visit: Payer: Medicaid Other | Attending: Maternal & Fetal Medicine

## 2023-05-20 ENCOUNTER — Ambulatory Visit: Payer: Medicaid Other | Admitting: Obstetrics and Gynecology

## 2023-05-20 VITALS — BP 121/78 | HR 86 | Wt 182.4 lb

## 2023-05-20 DIAGNOSIS — O24419 Gestational diabetes mellitus in pregnancy, unspecified control: Secondary | ICD-10-CM | POA: Insufficient documentation

## 2023-05-20 DIAGNOSIS — O99820 Streptococcus B carrier state complicating pregnancy: Secondary | ICD-10-CM | POA: Insufficient documentation

## 2023-05-20 DIAGNOSIS — O24415 Gestational diabetes mellitus in pregnancy, controlled by oral hypoglycemic drugs: Secondary | ICD-10-CM

## 2023-05-20 DIAGNOSIS — O24410 Gestational diabetes mellitus in pregnancy, diet controlled: Secondary | ICD-10-CM | POA: Diagnosis not present

## 2023-05-20 DIAGNOSIS — O99113 Other diseases of the blood and blood-forming organs and certain disorders involving the immune mechanism complicating pregnancy, third trimester: Secondary | ICD-10-CM

## 2023-05-20 DIAGNOSIS — Z3A38 38 weeks gestation of pregnancy: Secondary | ICD-10-CM

## 2023-05-20 DIAGNOSIS — O35BXX0 Maternal care for other (suspected) fetal abnormality and damage, fetal cardiac anomalies, not applicable or unspecified: Secondary | ICD-10-CM | POA: Diagnosis not present

## 2023-05-20 DIAGNOSIS — D696 Thrombocytopenia, unspecified: Secondary | ICD-10-CM | POA: Diagnosis not present

## 2023-05-20 DIAGNOSIS — O0993 Supervision of high risk pregnancy, unspecified, third trimester: Secondary | ICD-10-CM

## 2023-05-20 NOTE — Patient Instructions (Signed)

## 2023-05-20 NOTE — Progress Notes (Signed)
   PRENATAL VISIT NOTE  Subjective:  Shelly Watson is a 27 y.o. G1P0000 at [redacted]w[redacted]d being seen today for ongoing prenatal care.  She is currently monitored for the following issues for this low-risk pregnancy and has Supervision of other normal pregnancy, antepartum; Benign gestational thrombocytopenia in second trimester [facility]); GDM (gestational diabetes mellitus); and Anemia on their problem list.  Patient reports no complaints.  Contractions: Irritability. Vag. Bleeding: None.  Movement: Present. Denies leaking of fluid.   The following portions of the patient's history were reviewed and updated as appropriate: allergies, current medications, past family history, past medical history, past social history, past surgical history and problem list.   Objective:   Vitals:   05/20/23 1432  BP: 121/78  Pulse: 86  Weight: 182 lb 6.4 oz (82.7 kg)    Fetal Status: Fetal Heart Rate (bpm): 141   Movement: Present     General:  Alert, oriented and cooperative. Patient is in no acute distress.  Skin: Skin is warm and dry. No rash noted.   Cardiovascular: Normal heart rate noted  Respiratory: Normal respiratory effort, no problems with respiration noted  Abdomen: Soft, gravid, appropriate for gestational age.  Pain/Pressure: Present (Pressure)     Pelvic: Cervical exam deferred        Extremities: Normal range of motion.  Edema: Trace (Feet and hands)  Mental Status: Normal mood and affect. Normal behavior. Normal judgment and thought content.   Assessment and Plan:  Pregnancy: G1P0000 at [redacted]w[redacted]d 1. Supervision of high risk pregnancy in third trimester BP and FHR normal Feeling regular fetal movement  2. Gestational diabetes mellitus (GDM) in third trimester controlled on oral hypoglycemic drug Did not bring log, reports Still elevated in the morning  (96-115s) and normal pp  Not taking metformin, hasn't picked up prescription, reports she will pick it up today  Scheduling 39 week IOL today    3. [redacted] weeks gestation of pregnancy Follow up ultrasound scheduled after this appt today   Preterm labor symptoms and general obstetric precautions including but not limited to vaginal bleeding, contractions, leaking of fluid and fetal movement were reviewed in detail with the patient. Please refer to After Visit Summary for other counseling recommendations.     Future Appointments  Date Time Provider Department Center  05/20/2023  3:30 PM [facility] US3 [facility]  05/24/2023  6:30 AM [facility] SCHED ROOM [facility] None     Albertine Grates, FNP

## 2023-05-21 ENCOUNTER — Telehealth (HOSPITAL_COMMUNITY): Payer: Self-pay | Admitting: *Deleted

## 2023-05-21 ENCOUNTER — Encounter (HOSPITAL_COMMUNITY): Payer: Self-pay | Admitting: *Deleted

## 2023-05-21 NOTE — Telephone Encounter (Signed)
Preadmission screen  

## 2023-05-22 ENCOUNTER — Inpatient Hospital Stay (HOSPITAL_COMMUNITY)
Admission: AD | Admit: 2023-05-22 | Discharge: 2023-05-24 | DRG: 806 | Disposition: A | Discharge status: Home / Self Care | Payer: Medicaid Other | Attending: Obstetrics and Gynecology | Admitting: Obstetrics and Gynecology

## 2023-05-22 ENCOUNTER — Encounter (HOSPITAL_COMMUNITY): Payer: Self-pay | Admitting: Obstetrics & Gynecology

## 2023-05-22 ENCOUNTER — Other Ambulatory Visit: Payer: Self-pay

## 2023-05-22 DIAGNOSIS — O24424 Gestational diabetes mellitus in childbirth, insulin controlled: Secondary | ICD-10-CM | POA: Diagnosis not present

## 2023-05-22 DIAGNOSIS — O4292 Full-term premature rupture of membranes, unspecified as to length of time between rupture and onset of labor: Principal | ICD-10-CM

## 2023-05-22 DIAGNOSIS — Z91199 Patient's noncompliance with other medical treatment and regimen due to unspecified reason: Secondary | ICD-10-CM

## 2023-05-22 DIAGNOSIS — O24425 Gestational diabetes mellitus in childbirth, controlled by oral hypoglycemic drugs: Secondary | ICD-10-CM | POA: Diagnosis present

## 2023-05-22 DIAGNOSIS — O9081 Anemia of the puerperium: Secondary | ICD-10-CM | POA: Diagnosis not present

## 2023-05-22 DIAGNOSIS — O26893 Other specified pregnancy related conditions, third trimester: Secondary | ICD-10-CM | POA: Diagnosis present

## 2023-05-22 DIAGNOSIS — Z3A38 38 weeks gestation of pregnancy: Secondary | ICD-10-CM

## 2023-05-22 DIAGNOSIS — D62 Acute posthemorrhagic anemia: Secondary | ICD-10-CM | POA: Diagnosis not present

## 2023-05-22 DIAGNOSIS — O9912 Other diseases of the blood and blood-forming organs and certain disorders involving the immune mechanism complicating childbirth: Secondary | ICD-10-CM | POA: Diagnosis not present

## 2023-05-22 DIAGNOSIS — O99820 Streptococcus B carrier state complicating pregnancy: Secondary | ICD-10-CM | POA: Diagnosis not present

## 2023-05-22 DIAGNOSIS — O99824 Streptococcus B carrier state complicating childbirth: Secondary | ICD-10-CM | POA: Diagnosis present

## 2023-05-22 DIAGNOSIS — O4290 Premature rupture of membranes, unspecified as to length of time between rupture and onset of labor, unspecified weeks of gestation: Secondary | ICD-10-CM | POA: Diagnosis present

## 2023-05-22 DIAGNOSIS — D696 Thrombocytopenia, unspecified: Secondary | ICD-10-CM | POA: Diagnosis present

## 2023-05-22 DIAGNOSIS — O24419 Gestational diabetes mellitus in pregnancy, unspecified control: Secondary | ICD-10-CM | POA: Diagnosis present

## 2023-05-22 DIAGNOSIS — O0990 Supervision of high risk pregnancy, unspecified, unspecified trimester: Secondary | ICD-10-CM

## 2023-05-22 DIAGNOSIS — O24415 Gestational diabetes mellitus in pregnancy, controlled by oral hypoglycemic drugs: Secondary | ICD-10-CM | POA: Diagnosis present

## 2023-05-22 DIAGNOSIS — O0993 Supervision of high risk pregnancy, unspecified, third trimester: Secondary | ICD-10-CM

## 2023-05-22 DIAGNOSIS — O99112 Other diseases of the blood and blood-forming organs and certain disorders involving the immune mechanism complicating pregnancy, second trimester: Secondary | ICD-10-CM | POA: Diagnosis present

## 2023-05-22 DIAGNOSIS — O99013 Anemia complicating pregnancy, third trimester: Secondary | ICD-10-CM | POA: Diagnosis present

## 2023-05-22 DIAGNOSIS — O4202 Full-term premature rupture of membranes, onset of labor within 24 hours of rupture: Secondary | ICD-10-CM | POA: Diagnosis not present

## 2023-05-22 LAB — GLUCOSE, CAPILLARY
Glucose-Capillary: 113 mg/dL — ABNORMAL HIGH (ref 70–99)
Glucose-Capillary: 109 mg/dL — ABNORMAL HIGH (ref 70–99)
Glucose-Capillary: 117 mg/dL — ABNORMAL HIGH (ref 70–99)

## 2023-05-22 LAB — TYPE AND SCREEN
ABO/RH(D): O POS
Antibody Screen: NEGATIVE

## 2023-05-22 LAB — CBC
HCT: 29.9 % — ABNORMAL LOW (ref 36.0–46.0)
Hemoglobin: 10.1 g/dL — ABNORMAL LOW (ref 12.0–15.0)
MCH: 31.5 pg (ref 26.0–34.0)
MCHC: 33.8 g/dL (ref 30.0–36.0)
MCV: 93.1 fL (ref 80.0–100.0)
Platelets: 129 10*3/uL — ABNORMAL LOW (ref 150–400)
RBC: 3.21 MIL/uL — ABNORMAL LOW (ref 3.87–5.11)
RDW: 13.7 % (ref 11.5–15.5)
WBC: 8.4 10*3/uL (ref 4.0–10.5)
nRBC: 0 % (ref 0.0–0.2)

## 2023-05-22 LAB — POCT FERN TEST: POCT Fern Test: POSITIVE

## 2023-05-22 LAB — RPR: RPR Ser Ql: NONREACTIVE

## 2023-05-22 MED ORDER — LIDOCAINE HCL (PF) 1 % IJ SOLN
30.0000 mL | INTRAMUSCULAR | Status: AC | PRN
  Administered 2023-05-22: 30 mL via SUBCUTANEOUS

## 2023-05-22 MED ORDER — ONDANSETRON HCL 4 MG/2ML IJ SOLN: 4.0000 mg | Freq: Four times a day (QID) | INTRAMUSCULAR | Status: DC | PRN

## 2023-05-22 MED ORDER — IBUPROFEN 600 MG PO TABS
600.0000 mg | ORAL_TABLET | Freq: Four times a day (QID) | ORAL | Status: DC
  Administered 2023-05-22 – 2023-05-24 (×3): 600 mg via ORAL
  Filled 2023-05-22 (×5): qty 1

## 2023-05-22 MED ORDER — BENZOCAINE-MENTHOL 20-0.5 % EX AERO: 1.0000 | INHALATION_SPRAY | CUTANEOUS | Status: DC | PRN

## 2023-05-22 MED ORDER — OXYCODONE-ACETAMINOPHEN 5-325 MG PO TABS: 1.0000 | ORAL_TABLET | ORAL | Status: DC | PRN

## 2023-05-22 MED ORDER — SIMETHICONE 80 MG PO CHEW: 80.0000 mg | CHEWABLE_TABLET | ORAL | Status: DC | PRN

## 2023-05-22 MED ORDER — WITCH HAZEL-GLYCERIN EX PADS: 1.0000 | MEDICATED_PAD | CUTANEOUS | Status: DC | PRN

## 2023-05-22 MED ORDER — PENICILLIN G POT IN DEXTROSE 60000 UNIT/ML IV SOLN
3.0000 10*6.[IU] | INTRAVENOUS | Status: DC
  Administered 2023-05-22 (×2): 3 10*6.[IU] via INTRAVENOUS
  Filled 2023-05-22 (×3): qty 50

## 2023-05-22 MED ORDER — OXYTOCIN-SODIUM CHLORIDE 30-0.9 UT/500ML-% IV SOLN
2.5000 [IU]/h | INTRAVENOUS | Status: DC
Start: 2023-05-22 — End: 2023-05-22

## 2023-05-22 MED ORDER — FENTANYL CITRATE (PF) 100 MCG/2ML IJ SOLN
50.0000 ug | INTRAMUSCULAR | Status: DC | PRN
Start: 2023-05-22 — End: 2023-05-22
  Administered 2023-05-22: 100 ug via INTRAVENOUS
  Administered 2023-05-22: 50 ug via INTRAVENOUS
  Filled 2023-05-22 (×2): qty 2

## 2023-05-22 MED ORDER — OXYTOCIN BOLUS FROM INFUSION
333.0000 mL | Freq: Once | INTRAVENOUS | Status: AC
  Administered 2023-05-22: 333 mL via INTRAVENOUS

## 2023-05-22 MED ORDER — TERBUTALINE SULFATE 1 MG/ML IJ SOLN: 0.2500 mg | Freq: Once | INTRAMUSCULAR | Status: DC | PRN

## 2023-05-22 MED ORDER — LACTATED RINGERS IV SOLN
INTRAVENOUS | Status: DC
Start: 2023-05-22 — End: 2023-05-22

## 2023-05-22 MED ORDER — OXYCODONE-ACETAMINOPHEN 5-325 MG PO TABS: 2.0000 | ORAL_TABLET | ORAL | Status: DC | PRN

## 2023-05-22 MED ORDER — MISOPROSTOL 50MCG HALF TABLET: 50.0000 ug | ORAL_TABLET | Freq: Once | ORAL | Status: DC

## 2023-05-22 MED ORDER — LIDOCAINE HCL (PF) 1 % IJ SOLN
30.0000 mL | INTRAMUSCULAR | Status: DC | PRN
  Filled 2023-05-22: qty 30

## 2023-05-22 MED ORDER — OXYTOCIN BOLUS FROM INFUSION
333.0000 mL | Freq: Once | INTRAVENOUS | Status: DC
Start: 2023-05-22 — End: 2023-05-22

## 2023-05-22 MED ORDER — ONDANSETRON HCL 4 MG/2ML IJ SOLN: 4.0000 mg | INTRAMUSCULAR | Status: DC | PRN

## 2023-05-22 MED ORDER — TRANEXAMIC ACID-NACL 1000-0.7 MG/100ML-% IV SOLN
INTRAVENOUS | Status: AC
  Administered 2023-05-22: 1000 mg
  Filled 2023-05-22: qty 100

## 2023-05-22 MED ORDER — DIBUCAINE (PERIANAL) 1 % EX OINT: 1.0000 | TOPICAL_OINTMENT | CUTANEOUS | Status: DC | PRN

## 2023-05-22 MED ORDER — LACTATED RINGERS IV SOLN: 500.0000 mL | INTRAVENOUS | Status: DC | PRN

## 2023-05-22 MED ORDER — PRENATAL MULTIVITAMIN CH
1.0000 | ORAL_TABLET | Freq: Every day | ORAL | Status: DC
  Administered 2023-05-23 – 2023-05-24 (×2): 1 via ORAL
  Filled 2023-05-22 (×2): qty 1

## 2023-05-22 MED ORDER — MISOPROSTOL 25 MCG QUARTER TABLET: 25.0000 ug | ORAL_TABLET | Freq: Once | ORAL | Status: DC

## 2023-05-22 MED ORDER — LACTATED RINGERS IV SOLN: INTRAVENOUS | Status: DC

## 2023-05-22 MED ORDER — ACETAMINOPHEN 325 MG PO TABS: 650.0000 mg | ORAL_TABLET | ORAL | Status: DC | PRN

## 2023-05-22 MED ORDER — COCONUT OIL OIL: 1.0000 | TOPICAL_OIL | Status: DC | PRN

## 2023-05-22 MED ORDER — PENICILLIN G POTASSIUM 5000000 UNITS IJ SOLR
5.0000 10*6.[IU] | Freq: Once | INTRAMUSCULAR | Status: AC
  Administered 2023-05-22: 5 10*6.[IU] via INTRAVENOUS
  Filled 2023-05-22: qty 5

## 2023-05-22 MED ORDER — SOD CITRATE-CITRIC ACID 500-334 MG/5ML PO SOLN: 30.0000 mL | ORAL | Status: DC | PRN

## 2023-05-22 MED ORDER — SENNOSIDES-DOCUSATE SODIUM 8.6-50 MG PO TABS
2.0000 | ORAL_TABLET | Freq: Every day | ORAL | Status: DC
  Filled 2023-05-22 (×2): qty 2

## 2023-05-22 MED ORDER — SODIUM CHLORIDE 0.9 % IV SOLN
5.0000 10*6.[IU] | Freq: Once | INTRAVENOUS | Status: AC
  Administered 2023-05-22: 5 10*6.[IU] via INTRAVENOUS
  Filled 2023-05-22: qty 5

## 2023-05-22 MED ORDER — OXYTOCIN-SODIUM CHLORIDE 30-0.9 UT/500ML-% IV SOLN
2.5000 [IU]/h | INTRAVENOUS | Status: DC
  Filled 2023-05-22: qty 500

## 2023-05-22 MED ORDER — OXYCODONE HCL 5 MG PO TABS: 10.0000 mg | ORAL_TABLET | ORAL | Status: DC | PRN

## 2023-05-22 MED ORDER — OXYCODONE HCL 5 MG PO TABS: 5.0000 mg | ORAL_TABLET | ORAL | Status: DC | PRN

## 2023-05-22 MED ORDER — DIPHENHYDRAMINE HCL 25 MG PO CAPS: 25.0000 mg | ORAL_CAPSULE | Freq: Four times a day (QID) | ORAL | Status: DC | PRN

## 2023-05-22 MED ORDER — ONDANSETRON HCL 4 MG PO TABS: 4.0000 mg | ORAL_TABLET | ORAL | Status: DC | PRN

## 2023-05-22 NOTE — Discharge Summary (Signed)
Postpartum Discharge Summary  Date of Service updated***     Patient Name: Shelly Watson DOB: 1996-04-24 MRN: [id number]  Date of admission: 05/22/2023 Delivery date:05/22/2023 Delivering provider: Edd Arbour R Date of discharge: 05/22/2023  Admitting diagnosis: Gestational diabetes mellitus, antepartum [O24.419] Leakage of amniotic fluid [O42.90] Intrauterine pregnancy: [redacted]w[redacted]d     Secondary diagnosis:  Principal Problem:   Gestational diabetes mellitus (GDM) in third trimester controlled on oral hypoglycemic drug Active Problems:   Supervision of high-risk pregnancy   Benign gestational thrombocytopenia in second trimester (HCC)   Anemia complicating pregnancy in third trimester   Group B Streptococcus carrier, +RV culture, currently pregnant   Gestational diabetes mellitus, antepartum   Leakage of amniotic fluid  Additional problems: ***    Discharge diagnosis: Term Pregnancy Delivered                                              Post partum procedures:{Postpartum procedures:23558} Augmentation: N/A Complications: None  Hospital course: Onset of Labor With Vaginal Delivery      27 y.o. yo G1P0000 at [redacted]w[redacted]d was admitted in Active Labor on 05/22/2023. Labor course was complicated by none  Membrane Rupture Time/Date: 6:16 AM,05/22/2023  Delivery Method:Vaginal, Spontaneous Operative Delivery:N/A Episiotomy: None Lacerations:    Patient had a postpartum course complicated by ***.  She is ambulating, tolerating a regular diet, passing flatus, and urinating well. Patient is discharged home in stable condition on 05/22/23.  Newborn Data: Birth date:05/22/2023 Birth time:7:17 PM Gender:Female Living status:Living Apgars:8 ,9  Weight:   Magnesium Sulfate received: No BMZ received: No Rhophylac:N/A MMR:N/A T-DaP:Given prenatally Flu: N/A RSV Vaccine received: No Transfusion:No  Immunizations received: There is no immunization history for the selected administration  types on file for this patient.  Physical exam  Vitals:   05/22/23 1916 05/22/23 1921 05/22/23 1926 05/22/23 1931  BP:      Pulse:      Resp:      Temp:      TempSrc:      SpO2: 99% 98% 99% 99%  Weight:      Height:       General: {Exam; general:21111117} Lochia: {Desc; appropriate/inappropriate:30686::"appropriate"} Uterine Fundus: {Desc; firm/soft:30687} Incision: {Exam; incision:21111123} DVT Evaluation: {Exam; dvt:2111122} Labs: Lab Results  Component Value Date   WBC 8.4 05/22/2023   HGB 10.1 (L) 05/22/2023   HCT 29.9 (L) 05/22/2023   MCV 93.1 05/22/2023   PLT 129 (L) 05/22/2023      Latest Ref Rng & Units 04/27/2023    9:04 PM  CMP  Glucose 70 - 99 mg/dL 87   BUN 6 - 20 mg/dL 5   Creatinine 4.09 - 8.11 mg/dL 9.14   Sodium 782 - 956 mmol/L 133   Potassium 3.5 - 5.1 mmol/L 3.3   Chloride 98 - 111 mmol/L 100   CO2 22 - 32 mmol/L 21   Calcium 8.9 - 10.3 mg/dL 21.3   Total Protein 6.5 - 8.1 g/dL 6.9   Total Bilirubin 0.3 - 1.2 mg/dL 0.2   Alkaline Phos 38 - 126 U/L 86   AST 15 - 41 U/L 15   ALT 0 - 44 U/L 10    Edinburgh Score:     No data to display         No data recorded  After visit meds:  Allergies as of 05/22/2023  Reactions   Aspirin Itching, Rash     Med Rec must be completed prior to using this [facility]***        Discharge home in stable condition Infant Feeding: {Baby feeding:23562} Infant Disposition:{CHL IP OB HOME WITH ZOXWRU:04540} Discharge instruction: per After Visit Summary and Postpartum booklet. Activity: Advance as tolerated. Pelvic rest for 6 weeks.  Diet: {OB JWJX:91478295}  Future Appointments: Future Appointments  Date Time Provider Department Center  05/24/2023  6:30 AM [facility] SCHED ROOM [facility] None   Follow up Visit: Message sent to [facility] on 05/22/23 by Tyler Aas, CNM Please schedule this patient for a In person postpartum visit in 6 weeks with the following provider: Any provider. Additional  Postpartum F/U: None   Low risk pregnancy complicated by:  None Delivery mode:  Vaginal, Spontaneous Anticipated Birth Control:   None   05/22/2023 Bernerd Limbo, CNM

## 2023-05-22 NOTE — MAU Note (Signed)
Pt informed that the ultrasound is considered a limited OB ultrasound and is not intended to be a complete ultrasound exam.  Patient also informed that the ultrasound is not being completed with the intent of assessing for fetal or placental anomalies or any pelvic abnormalities.  Explained that the purpose of today's ultrasound is to assess for presentation.  Patient acknowledges the purpose of the exam and the limitations of the study.    Vertex presentation confirmed by bedside US.  

## 2023-05-22 NOTE — Lactation Note (Signed)
This note was copied from a baby's chart. Lactation Consultation Note  Patient Name: Shelly Watson [id number] Date: 05/22/2023 Age:25 hours  Mom's admission feeding choice was formula. Noted mom BF in L&D. Asked RN what is mom's current feeding choice. RN stated she is going to do both Breast and formula. RN stated mom is exhausted and is going to formula feed tonight. Just see mom in am.  Asked RN to tell mom if she needs any assistance tonight to please call me.    Maternal Data    Feeding Nipple Type: Slow - flow  LATCH Score                    Lactation Tools Discussed/Used    Interventions    Discharge    Consult Status      Charyl Dancer 05/22/2023, 11:40 PM

## 2023-05-22 NOTE — H&P (Signed)
OBSTETRIC ADMISSION HISTORY AND PHYSICAL  Shelly Watson is a 27 y.o. female G1P0000 with IUP at [redacted]w[redacted]d by LMP presenting for SROM clear fluid at [phone]. She reports +FMs, no VB, no blurry vision, headaches or peripheral edema, and RUQ pain.  She plans on breast and bottle feeding. She declines birth control.  She received her prenatal care at  [facility]    Dating: By LMP --->  Estimated Date of Delivery: 05/31/23  Sono:   @[redacted]w[redacted]d , CWD, normal anatomy, cephalic presentation, anterior placental lie, EFW 3139 gm 6 lb 15 oz 33 %, AC 91%.  Prenatal History/Complications:  - Gestational thrombocytopenia; 134(on 10/7) - GBS positive - GDM A1; noncompliant with Metformin 500mg  daily  Past Medical History: Past Medical History:  Diagnosis Date   GDM (gestational diabetes mellitus) 02/2023   Gestational diabetes    Gestational thrombocytopenia (HCC)    No pertinent past medical history    Strep throat     Past Surgical History: Past Surgical History:  Procedure Laterality Date   NO PAST SURGERIES      Obstetrical History: OB History     Gravida  1   Para  0   Term  0   Preterm  0   AB  0   Living  0      SAB  0   IAB  0   Ectopic  0   Multiple  0   Live Births  0           Social History Social History   Socioeconomic History   Marital status: Single    Spouse name: Not on file   Number of children: Not on file   Years of education: Not on file   Highest education level: Not on file  Occupational History   Not on file  Tobacco Use   Smoking status: Never    Passive exposure: Never   Smokeless tobacco: Never  Vaping Use   Vaping status: Never Used  Substance and Sexual Activity   Alcohol use: No   Drug use: No   Sexual activity: Not Currently    Partners: Female    Comment: menarche 15yo, sexual debut 27yo  Other Topics Concern   Not on file  Social History Narrative   Not on file   Social Determinants of Health   Financial Resource  Strain: Not on file  Food Insecurity: Not on file  Transportation Needs: Not on file  Physical Activity: Not on file  Stress: Not on file  Social Connections: Not on file    Family History: History reviewed. No pertinent family history.  Allergies: Allergies  Allergen Reactions   Aspirin Itching and Rash    Medications Prior to Admission  Medication Sig Dispense Refill Last Dose   Prenatal Vit-Fe Fumarate-FA (PRENATAL VITAMINS PO) Take 1 tablet by mouth daily.   05/22/2023   Blood Pressure Monitoring (BLOOD PRESSURE KIT) DEVI 1 Device by Does not apply route once a week. 1 each 0    ferrous sulfate 325 (65 FE) MG tablet Take 1 tablet (325 mg total) by mouth every other day. (Patient not taking: Reported on 03/25/2023) 90 tablet 1    metFORMIN (GLUCOPHAGE) 500 MG tablet Take 1 tablet (500 mg total) by mouth daily. Take 1 tablet daily after dinner (Patient not taking: Reported on 05/20/2023) 60 tablet 5      Review of Systems   All systems reviewed and negative except as stated in HPI  Blood pressure 127/82, pulse  85, temperature 98.7 F (37.1 C), temperature source Oral, resp. rate 18, weight 78 kg, last menstrual period 08/24/2022, SpO2 100%. General appearance: alert, cooperative, appears stated age, and no distress Lungs: clear to auscultation bilaterally Heart: regular rate and rhythm Abdomen: soft, non-tender; bowel sounds normal Extremities: Homans sign is negative, no sign of DVT Presentation: cephalic Fetal monitoringBaseline: 135 bpm, Variability: Good {> 6 bpm), Accelerations: Reactive, and Decelerations: Absent Uterine activity irregular   Prenatal labs: ABO, Rh: O/Positive/-- (04/24 1638) Antibody: Negative (04/24 1638) Rubella: 1.53 (04/24 1638) RPR: Non Reactive (08/09 0914)  HBsAg: Negative (04/24 1638)  HIV: Non Reactive (08/09 0914)  GBS: Positive/-- (10/23 1457)  2 hr Glucola failed; 95/142/125 Genetic screening  LR NIPS/Horizon negative  Anatomy US  Echogenic intracardiac focus of the heart (EIF)  Prenatal Transfer Tool  Maternal Diabetes: Yes:  Diabetes Type:  Insulin/Medication controlled; Metformin 500mg  daily Genetic Screening: Normal Maternal Ultrasounds/Referrals: Isolated EIF (echogenic intracardiac focus) Fetal Ultrasounds or other Referrals:  None Maternal Substance Abuse:  No Significant Maternal Medications:  None Significant Maternal Lab Results:  Group B Strep positive Number of Prenatal Visits:greater than 3 verified prenatal visits Other Comments:  None  Results for orders placed or performed during the hospital encounter of 05/22/23 (from the past 24 hour(s))  POCT fern test   Collection Time: 05/22/23  7:21 AM  Result Value Ref Range   POCT Fern Test Positive = ruptured amniotic membanes     Patient Active Problem List   Diagnosis Date Noted   Group B Streptococcus carrier, +RV culture, currently pregnant 05/20/2023   GDM (gestational diabetes mellitus) 03/01/2023   Anemia 03/01/2023   Benign gestational thrombocytopenia in second trimester (HCC) 11/23/2022   Supervision of other normal pregnancy, antepartum 11/10/2022    Assessment/Plan:  Shelly Watson is a 27 y.o. G1P0000 at [redacted]w[redacted]d here for admission for SROM 0616 05/22/23  #Labor:Expectant management for now, augmentation as indicated for cessation of progression. Cytotec, FB, Pitocin #Pain: Comfortable #FWB: Cat I #ID:  GBS positive; PCN #MOF: Breast and bottle #MOC: Declined #Circ:  Female  #Gestational thrombocytopenia - baseline 130s  #GDM A1; noncompliant with Metformin, elevated fasting readings.  - [redacted]w[redacted]d EFW 3139 gm 6 lb 15 oz 33 %, AC 91%.  Wyn Forster, MD  05/22/2023, 7:24 AM

## 2023-05-22 NOTE — Progress Notes (Signed)
LABOR NOTE Shelly Watson is a 27 y.o. G1P0000 at [redacted]w[redacted]d admitted for SROM clear fluid at [phone]. She plans to breast and formula feed. She declines birth control at this time.   Subjective: uncomfortable w/ contractions and requesting nitrous oxide  Objective: BP 119/70   Pulse 80   Temp 98.5 F (36.9 C) (Oral)   Resp 19   Ht 5\' 4"  (1.626 m)   Wt 82.6 kg   LMP 08/24/2022 (Exact Date)   SpO2 100%   BMI 31.24 kg/m  No intake/output data recorded.  FHR baseline 145 bpm, Variability: moderate, Accelerations:present, Decelerations:  Absent Toco: q 2-4 mins   SVE:   Dilation: Closed Exam by:: Erle Crocker, RN   Labs: Lab Results  Component Value Date   WBC 8.4 05/22/2023   HGB 10.1 (L) 05/22/2023   HCT 29.9 (L) 05/22/2023   MCV 93.1 05/22/2023   PLT 129 (L) 05/22/2023    Assessment / Plan: Spontaneous labor, progressing normally She declines cervical checks at this time. She will continue with position changes including. We will continue with expectant management of labor.   Labor: early Fetal Wellbeing:  Category I Pain Control:  labor support without medications and nitrous oxide I/D:  PCN for GBS+ Anticipated MOD: NSVB  Cleda Mccreedy Student Nurse Midwife  05/22/2023, 10:27 AM

## 2023-05-22 NOTE — MAU Note (Signed)
.  Shelly Watson is a 27 y.o. at [redacted]w[redacted]d here in MAU reporting: Possible SROM at [phone].  Contractions every:  Patient is unsure of how often they are coming and unsure if they are close together or far apart. Onset of ctx: Today Pain score: Pain Assessment Pain Assessment: 0-10 Pain Score: 4  Pain Location: Pelvis Pain Descriptors / Indicators: Contraction, Cramping Pain Frequency: Intermittent Pain Onset: On-going  ROM: Clear, watery Vaginal Bleeding: Denies  Last SVE: N/A Labor Pain Management Plan: Undecided  Fetal Movement: Reports positive FM FHT: Fetal Heart Rate Mode: External Baseline Rate (A): 135 bpm  Vitals:   05/22/23 0714  BP: 132/88  Pulse: 86  Resp: 18  Temp: 98.7 F (37.1 C)  SpO2: 100%     OB Office: Faculty GBS: Positive Lab orders placed from triage: MAU Labor Eval

## 2023-05-22 NOTE — Progress Notes (Signed)
LABOR NOTE Shelly Watson is a 27 y.o. G1P0000 at 105w5d admitted for SROM clear fluid at [phone]. Planning to breast and formula feed. She declines birth control at this time.   Subjective: uncomfortable w/ contractions, feeling pressure, and requesting pain meds  Objective: BP 116/60   Pulse 70   Temp 98.7 F (37.1 C) (Oral)   Resp 18   Ht 5\' 4"  (1.626 m)   Wt 82.6 kg   LMP 08/24/2022 (Exact Date)   SpO2 100%   BMI 31.24 kg/m  No intake/output data recorded.  FHR baseline 135 bpm, Variability: moderate, Accelerations:present, Decelerations:  Absent Toco: q 1-4 mins   SVE:   Dilation: Closed Exam by:: Erle Crocker, RN   Labs: Lab Results  Component Value Date   WBC 8.4 05/22/2023   HGB 10.1 (L) 05/22/2023   HCT 29.9 (L) 05/22/2023   MCV 93.1 05/22/2023   PLT 129 (L) 05/22/2023    Assessment / Plan: Spontaneous labor, progressing normally Requesting IV pain medication feeling pressure with contractions. Declining cervical check at this time. She will let us know when she wants to be evaluated. At that time we will be able to have shared decision making on IV pain medication. We will continue with expectant management at this time.   Labor: early Fetal Wellbeing:  Category I Pain Control:  labor support without medications, requesting IV pain meds, and nitrous oxide I/D:  PCN for GBS+ Anticipated MOD: NSVB  Cleda Mccreedy Student Nurse Midwife 05/22/2023, 3:48 PM

## 2023-05-22 NOTE — Progress Notes (Signed)
LABOR NOTE Shelly Watson is a 27 y.o. G1P0000 at [redacted]w[redacted]d admitted for SROM clear fluid at [phone]. Planning to breast and formula feed. She declines birth control at this time she is in a same sex relationship  Subjective: uncomfortable w/ contractions  Objective: BP 114/61   Pulse 79   Temp 97.9 F (36.6 C) (Oral)   Resp 18   Ht 5\' 4"  (1.626 m)   Wt 82.6 kg   LMP 08/24/2022 (Exact Date)   SpO2 100%   BMI 31.24 kg/m  No intake/output data recorded.  FHR baseline 135 bpm, Variability: moderate, Accelerations:present, Decelerations:  Absent Toco: q 2-4 mins   SVE:   Dilation: Closed Exam by:: Erle Crocker, RN  Labs: Lab Results  Component Value Date   WBC 8.4 05/22/2023   HGB 10.1 (L) 05/22/2023   HCT 29.9 (L) 05/22/2023   MCV 93.1 05/22/2023   PLT 129 (L) 05/22/2023    Assessment / Plan: Spontaneous labor progression unknown due to no cervical checks at this time. Patient is reporting occasional pressure with contractions. Not feeling the urge to push at this time. She has declined a cervix check at this time. We will continue with expectant management at this time.   Labor: early Fetal Wellbeing:  Category I Pain Control:  labor support without medications I/D:  PCN for GBS+ Anticipated MOD: NSVB  Cleda Mccreedy Student Nurse Midwife 05/22/2023, 5:28 PM

## 2023-05-22 NOTE — Progress Notes (Signed)
   05/22/23 1918  Spiritual Encounters  Type of Visit Initial  Conversation partners present during encounter Nurse  Referral source Code page  Reason for visit Code  OnCall Visit Yes   Responded to code page in Labor and Delivery. Mother delivered baby. Mother and baby are fine.

## 2023-05-23 LAB — CBC
HCT: 25.4 % — ABNORMAL LOW (ref 36.0–46.0)
Hemoglobin: 8.6 g/dL — ABNORMAL LOW (ref 12.0–15.0)
MCH: 30.7 pg (ref 26.0–34.0)
MCHC: 33.9 g/dL (ref 30.0–36.0)
MCV: 90.7 fL (ref 80.0–100.0)
Platelets: 119 10*3/uL — ABNORMAL LOW (ref 150–400)
RBC: 2.8 MIL/uL — ABNORMAL LOW (ref 3.87–5.11)
RDW: 13.6 % (ref 11.5–15.5)
WBC: 16.4 10*3/uL — ABNORMAL HIGH (ref 4.0–10.5)
nRBC: 0 % (ref 0.0–0.2)

## 2023-05-23 MED ORDER — FERROUS SULFATE 325 (65 FE) MG PO TABS
325.0000 mg | ORAL_TABLET | ORAL | Status: DC
  Administered 2023-05-23: 325 mg via ORAL
  Filled 2023-05-23: qty 1

## 2023-05-23 NOTE — Lactation Note (Signed)
This note was copied from a baby's chart. Lactation Consultation Note  Patient Name: Shelly Watson [id number] Date: 05/23/2023 Age:25 hours  Reason for consult: Primapara;1st time breastfeeding;Maternal endocrine disorder  P76, [redacted]w[redacted]d  Mother has decided to only formula feed her baby. Discussed management of non-nursing engorgement. Instructed that if she wants to breastfeed to call out for assistance from nurse or LC.   Feeding Mother's Current Feeding Choice: Formula Nipple Type: Slow - flow   Interventions Interventions: Education;LC Services brochure  Discharge Discharge Education: Engorgement and breast care  Consult Status Consult Status: Complete Date: 05/23/23    Omar Person 05/23/2023, 4:10 PM

## 2023-05-23 NOTE — Progress Notes (Signed)
POSTPARTUM PROGRESS NOTE  Post Partum Day 1  Subjective:  Shelly Watson is a 27 y.o. G1P1001 s/p SVD at [redacted]w[redacted]d.  She reports she is doing well. No acute events overnight. She denies any problems with ambulating, voiding or po intake. Denies nausea or vomiting.  Pain is well controlled.  Lochia is < menses.  Objective: Blood pressure 105/75, pulse 66, temperature 99.6 F (37.6 C), temperature source Oral, resp. rate 17, height 5\' 4"  (1.626 m), weight 82.6 kg, last menstrual period 08/24/2022, SpO2 98%, unknown if currently breastfeeding.  Physical Exam:  General: alert, cooperative and no distress Chest: no respiratory distress Heart:regular rate, distal pulses intact Uterine Fundus: firm, appropriately tender DVT Evaluation: No calf swelling or tenderness Extremities: trace edema Skin: warm, dry  Recent Labs    05/22/23 0830 05/23/23 0427  HGB 10.1* 8.6*  HCT 29.9* 25.4*    Assessment/Plan: Shelly Watson is a 27 y.o. G1P1001 s/p SVD at [redacted]w[redacted]d   PPD#1 - Doing well  Routine postpartum care GDM: fasting PP 117 Anemia: chronic worsened by usual acute blood loss with delivery, Hgb 10.1>8.6  Start Oral Iron supplementation  Contraception: declines Feeding: both Dispo: Plan for discharge 11/3.   LOS: 1 day   Hessie Dibble, MD OB Fellow  05/23/2023, 5:52 AM

## 2023-05-24 ENCOUNTER — Inpatient Hospital Stay (HOSPITAL_COMMUNITY)
Admission: RE | Admit: 2023-05-24 | Payer: Medicaid Other | Source: Home / Self Care | Admitting: Obstetrics and Gynecology

## 2023-05-24 ENCOUNTER — Inpatient Hospital Stay (HOSPITAL_COMMUNITY): Payer: Medicaid Other

## 2023-05-24 MED ORDER — SENNOSIDES-DOCUSATE SODIUM 8.6-50 MG PO TABS: 2.0000 | ORAL_TABLET | Freq: Two times a day (BID) | ORAL | 0 refills | Status: DC | PRN

## 2023-06-08 ENCOUNTER — Telehealth (HOSPITAL_COMMUNITY): Payer: Self-pay | Admitting: *Deleted

## 2023-06-08 NOTE — Telephone Encounter (Signed)
06/08/2023  Name: Kentrell Fromme MRN: [id number] DOB: [birth year]  Reason for Call:  Transition of Care Hospital Discharge Call  Contact Status: Patient Contact Status: Complete  Language assistant needed: Interpreter Mode: Interpreter Not Needed        Follow-Up Questions: Do You Have Any Concerns About Your Health As You Heal From Delivery?: No Do You Have Any Concerns About Your Infants Health?: No  Edinburgh Postnatal Depression Scale:  In the Past 7 Days: I have been able to laugh and see the funny side of things.: As much as I always could I have looked forward with enjoyment to things.: As much as I ever did I have blamed myself unnecessarily when things went wrong.: No, never I have been anxious or worried for no good reason.: Yes, sometimes I have felt scared or panicky for no good reason.: No, not at all Things have been getting on top of me.: No, I have been coping as well as ever I have been so unhappy that I have had difficulty sleeping.: Not at all I have felt sad or miserable.: No, not at all I have been so unhappy that I have been crying.: No, never The thought of harming myself has occurred to me.: Never Edinburgh Postnatal Depression Scale Total: 2  PHQ2-9 Depression Scale:     Discharge Follow-up: Edinburgh score requires follow up?: No Patient was advised of the following resources:: Support Group, Breastfeeding Support Group  Post-discharge interventions: Reviewed Newborn Safe Sleep Practices  Salena Saner, RN 06/08/2023  11:29

## 2023-07-16 ENCOUNTER — Encounter: Payer: Self-pay | Admitting: Family Medicine

## 2023-07-16 ENCOUNTER — Ambulatory Visit (INDEPENDENT_AMBULATORY_CARE_PROVIDER_SITE_OTHER): Payer: Medicaid Other | Admitting: Family Medicine

## 2023-07-16 DIAGNOSIS — Z59819 Housing instability, housed unspecified: Secondary | ICD-10-CM | POA: Diagnosis not present

## 2023-07-16 DIAGNOSIS — K5900 Constipation, unspecified: Secondary | ICD-10-CM | POA: Diagnosis not present

## 2023-07-16 MED ORDER — POLYETHYLENE GLYCOL 3350 17 GM/SCOOP PO POWD: 17.0000 g | Freq: Every day | ORAL | 1 refills | Status: DC | PRN

## 2023-07-16 NOTE — Progress Notes (Signed)
Post Partum Visit Note  Shelly Watson is a 27 y.o. G43P1001 female who presents for a postpartum visit. She is 7 weeks postpartum following a normal spontaneous vaginal delivery.  I have fully reviewed the prenatal and intrapartum course. The delivery was at 38 gestational weeks.  Anesthesia: none. Postpartum course has been good. Baby is doing well. Baby is feeding by bottle - Similac Advance. Bleeding no bleeding. Bowel function is abnormal: constipation . Bladder function is normal. Patient is not sexually active. Contraception method is none. Postpartum depression screening: negative.   The pregnancy intention screening data noted above was reviewed. Potential methods of contraception were discussed. The patient elected to proceed with No data recorded.   Edinburgh Postnatal Depression Scale - 07/16/23 0918       Edinburgh Postnatal Depression Scale:  In the Past 7 Days   I have been able to laugh and see the funny side of things. 0    I have looked forward with enjoyment to things. 0    I have blamed myself unnecessarily when things went wrong. 0    I have been anxious or worried for no good reason. 2    I have felt scared or panicky for no good reason. 0    Things have been getting on top of me. 0    I have been so unhappy that I have had difficulty sleeping. 2    I have felt sad or miserable. 0    I have been so unhappy that I have been crying. 0    The thought of harming myself has occurred to me. 0    Edinburgh Postnatal Depression Scale Total 4             Health Maintenance Due  Topic Date Due   HPV VACCINES (1 - 3-dose series) Never done   DTaP/Tdap/Td (1 - Tdap) Never done   INFLUENZA VACCINE  Never done   COVID-19 Vaccine (1 - 2024-25 season) Never done    The following portions of the patient's history were reviewed and updated as appropriate: allergies, current medications, past family history, past medical history, past social history, past surgical  history, and problem list.  Review of Systems Pertinent items noted in HPI and remainder of comprehensive ROS otherwise negative.  Objective:  BP 103/63   Pulse 71   Ht 5\' 2"  (1.575 m)   Wt 165 lb (74.8 kg)   LMP 07/06/2023 (Exact Date)   Breastfeeding No   BMI 30.18 kg/m    General:  alert and cooperative   Breasts:  not indicated  Lungs: Breathing comfortably on room air  Heart:  Regular rate  Abdomen: Fundus firm below umbilicus    Wound N/A  GU exam:  not indicated       Assessment:   Postpartum care and examination  Housing insecurity  Constipation, unspecified constipation type - Plan: polyethylene glycol powder (GLYCOLAX/MIRALAX) 17 GM/SCOOP powder  Normal postpartum examination  Plan:   Essential components of care per ACOG recommendations:  1.  Mood and well being: Patient with negative depression screening today. Reviewed local resources for support.  - Patient tobacco use? Yes. Patient desires to quit? No.   - hx of drug use? No.    2. Infant care and feeding:  -Patient currently breastmilk feeding? No.  -Social determinants of health (SDOH) reviewed in EPIC. The following needs were identified - housing insecurity, resources provided  3. Sexuality, contraception and birth spacing - Patient does not  want a pregnancy in the next year.  Desired family size is 1 children.  - Reviewed reproductive life planning. Reviewed contraceptive methods based on pt preferences and effectiveness.  Patient desired no method of contraception today.   - Discussed birth spacing of 18 months  4. Sleep and fatigue -Encouraged family/partner/community support of 4 hrs of uninterrupted sleep to help with mood and fatigue  5. Physical Recovery  - Discussed patients delivery and complications. She describes her labor as mixed. - Patient had a Vaginal, no problems at delivery. Patient had a 2nd degree laceration. Perineal healing reviewed. Patient expressed understanding -  Patient has urinary incontinence? No. - Patient is safe to resume physical and sexual activity  6.  Health Maintenance - HM due items addressed Yes - Last pap smear  Diagnosis  Date Value Ref Range Status  11/12/2022   Final   - Negative for intraepithelial lesion or malignancy (NILM)   Pap smear not done at today's visit.  -Breast Cancer screening indicated? No.   7. Chronic Disease/Pregnancy Condition follow up: Gestational Diabetes -- recommend 2h PP GTT to be scheduled.  8. Housing insecurity: given resources today  9. Constipation: Rx for Miralax sent  - PCP follow up  Sundra Aland, MD [facility], [facility]

## 2023-07-16 NOTE — Patient Instructions (Signed)
Resources for Housing and Food:  Emergency Shelters [facility]: [address] [place], [place], [place] - [phone]  [facility]: [address] [place], [place], [place] - [phone] or [phone] [facility] ([facility]): [address] [place], [place] - [phone] [facility]: [place] [address] [place], [place] - [phone] [facility]: [address], [place], [place] - [phone]  Food Pantries [facility] [address] [place], [place], [place] [address] [phone]. [phone] Visit or call between 8:30am-5pm  [facility] [address] [place], [place] [address] [phone] Wed. & Sat. 2-6pm; appointments preferred  [facility] [address], [place], [place] [address] (Appointments required for food)  [phone]  [facility] [address], [place], [place] [address] [phone] Thurs. 2-4pm  [employer] [facility] [employer]  [address], [place], [place] [address] [phone] Mon.-Fri. 1:00-4:30pm  [facility] [address] [place], [place] [address] [phone] Tuesday and Thursday 9am - 12pm

## 2023-07-21 ENCOUNTER — Other Ambulatory Visit: Payer: Medicaid Other

## 2023-07-29 ENCOUNTER — Other Ambulatory Visit: Payer: Medicaid Other

## 2023-10-18 ENCOUNTER — Ambulatory Visit
Admission: EM | Admit: 2023-10-18 | Discharge: 2023-10-18 | Disposition: A | Discharge status: Home / Self Care | Attending: Family Medicine | Admitting: Family Medicine

## 2023-10-18 DIAGNOSIS — J988 Other specified respiratory disorders: Secondary | ICD-10-CM

## 2023-10-18 DIAGNOSIS — B9789 Other viral agents as the cause of diseases classified elsewhere: Secondary | ICD-10-CM

## 2023-10-18 LAB — POC COVID19/FLU A&B COMBO
Covid Antigen, POC: NEGATIVE
Influenza A Antigen, POC: NEGATIVE
Influenza B Antigen, POC: NEGATIVE

## 2023-10-18 MED ORDER — PROMETHAZINE-DM 6.25-15 MG/5ML PO SYRP: 5.0000 mL | ORAL_SOLUTION | Freq: Three times a day (TID) | ORAL | 0 refills | Status: AC | PRN

## 2023-10-18 MED ORDER — PSEUDOEPHEDRINE HCL 60 MG PO TABS: 60.0000 mg | ORAL_TABLET | Freq: Three times a day (TID) | ORAL | 0 refills | Status: AC | PRN

## 2023-10-18 MED ORDER — CETIRIZINE HCL 10 MG PO TABS: 10.0000 mg | ORAL_TABLET | Freq: Every day | ORAL | 0 refills | Status: AC

## 2023-10-18 NOTE — ED Triage Notes (Signed)
 Pt c/o prod cough, chest pain, sweating x 3-4 days-NAD-steady gait

## 2023-10-18 NOTE — ED Provider Notes (Signed)
 [facility]  Note:  This document was prepared using Conservation officer, historic buildings and may include unintentional dictation errors.  MRN: [id number] DOB: 1995-08-17  Subjective:   Shelly Watson is a 28 y.o. female presenting for 4 day history of malaise and fatigue, productive cough, chest pain from coughing, sweating. Denies history of allergies, asthma. Smokes a cigarette on occasion.   No current facility-administered medications for this encounter.  Current Outpatient Medications:    ferrous sulfate 325 (65 FE) MG tablet, Take 1 tablet (325 mg total) by mouth every other day. (Patient not taking: Reported on 07/16/2023), Disp: 90 tablet, Rfl: 1   metFORMIN (GLUCOPHAGE) 500 MG tablet, Take 1 tablet (500 mg total) by mouth daily. Take 1 tablet daily after dinner (Patient not taking: Reported on 07/16/2023), Disp: 60 tablet, Rfl: 5   polyethylene glycol powder (GLYCOLAX/MIRALAX) 17 GM/SCOOP powder, Take 17 g by mouth daily as needed., Disp: 510 g, Rfl: 1   Prenatal Vit-Fe Fumarate-FA (PRENATAL VITAMINS PO), Take 1 tablet by mouth daily. (Patient not taking: Reported on 07/16/2023), Disp: , Rfl:    senna-docusate (SENOKOT-S) 8.6-50 MG tablet, Take 2 tablets by mouth 2 (two) times daily as needed for mild constipation. (Patient not taking: Reported on 07/16/2023), Disp: 30 tablet, Rfl: 0   Allergies  Allergen Reactions   Aspirin Itching and Rash    Pt states she can take Ibu    Past Medical History:  Diagnosis Date   GDM (gestational diabetes mellitus) 02/2023   Gestational thrombocytopenia (HCC)    Strep throat      Past Surgical History:  Procedure Laterality Date   NO PAST SURGERIES      No family history on file.  Social History   Tobacco Use   Smoking status: Some Days    Types: Cigars    Passive exposure: Never   Smokeless tobacco: Never  Vaping Use   Vaping status: Never Used  Substance Use Topics   Alcohol use: No   Drug use: No     ROS   Objective:   Vitals: BP 96/65 (BP Location: Right Arm)   Pulse 87   Temp 100.3 F (37.9 C) (Oral)   Resp 16   LMP 10/16/2023   SpO2 96%   Physical Exam Constitutional:      General: She is not in acute distress.    Appearance: Normal appearance. She is well-developed and normal weight. She is not ill-appearing, toxic-appearing or diaphoretic.  HENT:     Head: Normocephalic and atraumatic.     Right Ear: Tympanic membrane, ear canal and external ear normal. No drainage or tenderness. No middle ear effusion. There is no impacted cerumen. Tympanic membrane is not erythematous or bulging.     Left Ear: Tympanic membrane, ear canal and external ear normal. No drainage or tenderness.  No middle ear effusion. There is no impacted cerumen. Tympanic membrane is not erythematous or bulging.     Nose: Nose normal. No congestion or rhinorrhea.     Mouth/Throat:     Mouth: Mucous membranes are moist. No oral lesions.     Pharynx: No pharyngeal swelling, oropharyngeal exudate, posterior oropharyngeal erythema or uvula swelling.     Tonsils: No tonsillar exudate or tonsillar abscesses.  Eyes:     General: No scleral icterus.       Right eye: No discharge.        Left eye: No discharge.     Extraocular Movements: Extraocular movements intact.  Right eye: Normal extraocular motion.     Left eye: Normal extraocular motion.     Conjunctiva/sclera: Conjunctivae normal.  Cardiovascular:     Rate and Rhythm: Normal rate and regular rhythm.     Heart sounds: Normal heart sounds. No murmur heard.    No friction rub. No gallop.  Pulmonary:     Effort: Pulmonary effort is normal. No respiratory distress.     Breath sounds: No stridor. No wheezing, rhonchi or rales.  Chest:     Chest wall: No tenderness.  Musculoskeletal:     Cervical back: Normal range of motion and neck supple.  Lymphadenopathy:     Cervical: No cervical adenopathy.  Skin:    General: Skin is warm and dry.   Neurological:     General: No focal deficit present.     Mental Status: She is alert and oriented to person, place, and time.  Psychiatric:        Mood and Affect: Mood normal.        Behavior: Behavior normal.    Results for orders placed or performed during the hospital encounter of 10/18/23 (from the past 24 hours)  POC Covid19/Flu A&B Antigen     Status: Normal   Collection Time: 10/18/23  2:13 PM  Result Value Ref Range   Influenza A Antigen, POC Negative    Influenza B Antigen, POC Negative    Covid Antigen, POC Negative     Assessment and Plan :   PDMP not reviewed this encounter.  1. Viral respiratory infection    Deferred imaging given clear cardiopulmonary exam, hemodynamically stable vital signs. Suspect viral URI, viral syndrome. Physical exam findings reassuring and vital signs stable for discharge. Advised supportive care, offered symptomatic relief. Counseled patient on potential for adverse effects with medications prescribed/recommended today, ER and return-to-clinic precautions discussed, patient verbalized understanding.     Wallis Bamberg, [place] 10/18/23 1414

## 2023-10-18 NOTE — Discharge Instructions (Signed)
 We will manage this as a viral respiratory infection. For sore throat or cough try using a honey-based tea. Use 3 teaspoons of honey with juice squeezed from half lemon. Place shaved pieces of ginger into 1/2-1 cup of water and warm over stove top. Then mix the ingredients and repeat every 4 hours as needed. Please take Tylenol 500mg -650mg  once every 6 hours for fevers, aches and pains. Hydrate very well with at least 2 liters (64 ounces) of water. Eat light meals such as soups (chicken and noodles, chicken wild rice, vegetable).  Do not eat any foods that you are allergic to.  Start an antihistamine like Zyrtec (10mg  daily) for postnasal drainage, sinus congestion.  You can take this together with pseudoephedrine (Sudafed) at a dose of 60 mg 3 times a day or twice daily as needed for the same kind of congestion.  Use cough syrup as needed.

## 2024-04-03 ENCOUNTER — Other Ambulatory Visit: Payer: Self-pay

## 2024-04-03 ENCOUNTER — Encounter (HOSPITAL_COMMUNITY): Payer: Self-pay

## 2024-04-03 ENCOUNTER — Emergency Department (HOSPITAL_COMMUNITY)
Admission: EM | Admit: 2024-04-03 | Discharge: 2024-04-03 | Discharge status: Against Medical Advice | Attending: Emergency Medicine | Admitting: Emergency Medicine

## 2024-04-03 DIAGNOSIS — Z5321 Procedure and treatment not carried out due to patient leaving prior to being seen by health care provider: Secondary | ICD-10-CM | POA: Diagnosis not present

## 2024-04-03 DIAGNOSIS — R519 Headache, unspecified: Secondary | ICD-10-CM | POA: Insufficient documentation

## 2024-04-03 LAB — RESP PANEL BY RT-PCR (RSV, FLU A&B, COVID)  RVPGX2
Influenza A by PCR: NEGATIVE
Influenza B by PCR: NEGATIVE
Resp Syncytial Virus by PCR: NEGATIVE
SARS Coronavirus 2 by RT PCR: NEGATIVE

## 2024-04-03 NOTE — ED Triage Notes (Signed)
 Pt reports headache for a week and worried about dehydration.

## 2024-04-03 NOTE — ED Notes (Signed)
 No answer for room

## 2024-07-27 ENCOUNTER — Ambulatory Visit: Admitting: Obstetrics and Gynecology
